# Patient Record
Sex: Male | Born: 1940 | Race: Black or African American | Hispanic: No | Marital: Married | Smoking: Former smoker
Health system: Southern US, Community
[De-identification: ages and names within clinical notes are randomized; demographics above are authoritative.]

## PROBLEM LIST (undated history)

## (undated) DIAGNOSIS — Z720 Tobacco use: Secondary | ICD-10-CM

## (undated) DIAGNOSIS — I519 Heart disease, unspecified: Secondary | ICD-10-CM

## (undated) DIAGNOSIS — C3490 Malignant neoplasm of unspecified part of unspecified bronchus or lung: Secondary | ICD-10-CM

## (undated) DIAGNOSIS — I10 Essential (primary) hypertension: Secondary | ICD-10-CM

## (undated) DIAGNOSIS — Z9221 Personal history of antineoplastic chemotherapy: Secondary | ICD-10-CM

## (undated) DIAGNOSIS — J40 Bronchitis, not specified as acute or chronic: Secondary | ICD-10-CM

## (undated) DIAGNOSIS — I2510 Atherosclerotic heart disease of native coronary artery without angina pectoris: Secondary | ICD-10-CM

## (undated) DIAGNOSIS — E785 Hyperlipidemia, unspecified: Secondary | ICD-10-CM

## (undated) DIAGNOSIS — H409 Unspecified glaucoma: Secondary | ICD-10-CM

## (undated) HISTORY — DX: Malignant neoplasm of unspecified part of unspecified bronchus or lung: C34.90

## (undated) HISTORY — DX: Bronchitis, not specified as acute or chronic: J40

## (undated) HISTORY — PX: OTHER SURGICAL HISTORY: SHX169

## (undated) HISTORY — DX: Hyperlipidemia, unspecified: E78.5

## (undated) HISTORY — PX: CARDIAC CATHETERIZATION: SHX172

## (undated) HISTORY — DX: Tobacco use: Z72.0

## (undated) HISTORY — DX: Essential (primary) hypertension: I10

## (undated) HISTORY — DX: Atherosclerotic heart disease of native coronary artery without angina pectoris: I25.10

## (undated) HISTORY — DX: Personal history of antineoplastic chemotherapy: Z92.21

## (undated) HISTORY — DX: Heart disease, unspecified: I51.9

---

## 1998-08-08 ENCOUNTER — Emergency Department (HOSPITAL_COMMUNITY): Admission: EM | Admit: 1998-08-08 | Discharge: 1998-08-08 | Payer: Self-pay | Admitting: Emergency Medicine

## 1998-09-16 ENCOUNTER — Emergency Department (HOSPITAL_COMMUNITY): Admission: EM | Admit: 1998-09-16 | Discharge: 1998-09-16 | Payer: Self-pay | Admitting: Emergency Medicine

## 1998-09-16 ENCOUNTER — Encounter: Payer: Self-pay | Admitting: Emergency Medicine

## 1998-09-17 ENCOUNTER — Emergency Department (HOSPITAL_COMMUNITY): Admission: EM | Admit: 1998-09-17 | Discharge: 1998-09-17 | Payer: Self-pay | Admitting: Emergency Medicine

## 1998-09-18 ENCOUNTER — Emergency Department (HOSPITAL_COMMUNITY): Admission: EM | Admit: 1998-09-18 | Discharge: 1998-09-18 | Payer: Self-pay | Admitting: Emergency Medicine

## 2000-09-19 ENCOUNTER — Encounter: Payer: Self-pay | Admitting: Emergency Medicine

## 2000-09-19 ENCOUNTER — Emergency Department (HOSPITAL_COMMUNITY): Admission: EM | Admit: 2000-09-19 | Discharge: 2000-09-19 | Payer: Self-pay | Admitting: Emergency Medicine

## 2001-04-04 ENCOUNTER — Encounter: Payer: Self-pay | Admitting: Emergency Medicine

## 2001-04-04 ENCOUNTER — Emergency Department (HOSPITAL_COMMUNITY): Admission: EM | Admit: 2001-04-04 | Discharge: 2001-04-04 | Payer: Self-pay | Admitting: Emergency Medicine

## 2006-11-01 ENCOUNTER — Emergency Department (HOSPITAL_COMMUNITY): Admission: EM | Admit: 2006-11-01 | Discharge: 2006-11-01 | Payer: Self-pay | Admitting: Emergency Medicine

## 2008-03-25 ENCOUNTER — Inpatient Hospital Stay (HOSPITAL_COMMUNITY): Admission: EM | Admit: 2008-03-25 | Discharge: 2008-03-26 | Payer: Self-pay | Admitting: Emergency Medicine

## 2008-05-14 ENCOUNTER — Encounter: Admission: RE | Admit: 2008-05-14 | Discharge: 2008-05-14 | Payer: Self-pay | Admitting: Neurosurgery

## 2008-06-04 ENCOUNTER — Emergency Department (HOSPITAL_COMMUNITY): Admission: EM | Admit: 2008-06-04 | Discharge: 2008-06-04 | Payer: Self-pay | Admitting: Emergency Medicine

## 2009-08-11 ENCOUNTER — Emergency Department (HOSPITAL_COMMUNITY): Admission: EM | Admit: 2009-08-11 | Discharge: 2009-08-11 | Payer: Self-pay | Admitting: Emergency Medicine

## 2010-01-02 ENCOUNTER — Emergency Department (HOSPITAL_COMMUNITY): Admission: EM | Admit: 2010-01-02 | Discharge: 2010-01-02 | Payer: Self-pay | Admitting: Emergency Medicine

## 2010-05-01 ENCOUNTER — Encounter: Payer: Self-pay | Admitting: Neurosurgery

## 2010-06-28 LAB — DIFFERENTIAL
Basophils Absolute: 0 10*3/uL (ref 0.0–0.1)
Basophils Relative: 1 % (ref 0–1)
Eosinophils Absolute: 0.1 10*3/uL (ref 0.0–0.7)
Monocytes Absolute: 0.6 10*3/uL (ref 0.1–1.0)
Monocytes Relative: 10 % (ref 3–12)
Neutro Abs: 2.7 10*3/uL (ref 1.7–7.7)
Neutrophils Relative %: 50 % (ref 43–77)

## 2010-06-28 LAB — BASIC METABOLIC PANEL
CO2: 30 mEq/L (ref 19–32)
Calcium: 9 mg/dL (ref 8.4–10.5)
Chloride: 107 mEq/L (ref 96–112)
Creatinine, Ser: 1.17 mg/dL (ref 0.4–1.5)
Glucose, Bld: 98 mg/dL (ref 70–99)

## 2010-06-28 LAB — CBC
MCHC: 34.4 g/dL (ref 30.0–36.0)
MCV: 86.2 fL (ref 78.0–100.0)
RDW: 14.9 % (ref 11.5–15.5)

## 2010-06-28 LAB — POCT CARDIAC MARKERS
CKMB, poc: 1.2 ng/mL (ref 1.0–8.0)
Myoglobin, poc: 78.3 ng/mL (ref 12–200)

## 2010-07-26 LAB — DIFFERENTIAL
Basophils Absolute: 0.1 10*3/uL (ref 0.0–0.1)
Basophils Relative: 1 % (ref 0–1)
Eosinophils Absolute: 0.1 10*3/uL (ref 0.0–0.7)
Monocytes Absolute: 0.7 10*3/uL (ref 0.1–1.0)
Monocytes Relative: 10 % (ref 3–12)
Neutro Abs: 3.6 10*3/uL (ref 1.7–7.7)

## 2010-07-26 LAB — POCT I-STAT, CHEM 8
Calcium, Ion: 1.15 mmol/L (ref 1.12–1.32)
Glucose, Bld: 109 mg/dL — ABNORMAL HIGH (ref 70–99)
HCT: 41 % (ref 39.0–52.0)
Hemoglobin: 13.9 g/dL (ref 13.0–17.0)
TCO2: 29 mmol/L (ref 0–100)

## 2010-07-26 LAB — CBC
Hemoglobin: 13.6 g/dL (ref 13.0–17.0)
MCHC: 33.9 g/dL (ref 30.0–36.0)
MCV: 85.5 fL (ref 78.0–100.0)
RBC: 4.7 MIL/uL (ref 4.22–5.81)
RDW: 15.1 % (ref 11.5–15.5)

## 2010-07-26 LAB — BRAIN NATRIURETIC PEPTIDE: Pro B Natriuretic peptide (BNP): <30 pg/mL (ref 0.0–100.0)

## 2010-07-26 LAB — POCT CARDIAC MARKERS: Myoglobin, poc: 129 ng/mL (ref 12–200)

## 2010-08-23 NOTE — Consult Note (Signed)
[name], [name]               ACCOUNT NO.:  [id number]   MEDICAL RECORD NO.:  [id number]          PATIENT TYPE:  EMS   LOCATION:  [facility]                         FACILITY:  [facility]   PHYSICIAN:  Cristi Loron, M.D.DATE OF BIRTH:  November 16, 1940   DATE OF CONSULTATION:  03/25/2008  DATE OF DISCHARGE:                                 CONSULTATION   CHIEF COMPLAINT:  Scalp soreness.   HISTORY OF PRESENT ILLNESS:  The patient is a 70 year old black male,  who was used to stay in good health until approximately 1830 this  evening.  He was approached by a friend of his dog, he was backing up,  he tripped and fell, striking the back of his head.  There was no loss  of consciousness.  The patient subsequently drove home.  He was  commenced by his daughter to come to the emergency department  approximately 2000.  He was evaluated by Dr. Oletta Lamas to include a cranial  CT scan which demonstrated the patient had a occipital skull fracture  and a traumatic subarachnoid hemorrhage.  A neurosurgical consultation  was requested.   Presently, the patient is accompanied by his family.  He is alert,  pleasant.  He has a minimal headache.  He has soreness of occipital  region.  He denies neck pain, back pain, numbness, tingling, weakness,  syncopal events presumably headaches prior to this fall, etc.   PAST MEDICAL HISTORY:  1. Hypertension.  2. Hypercholesteremia.  3. Coronary artery disease.   PAST SURGICAL HISTORY:  She had a heart catheterization by 15 years ago.   MEDICATIONS PRIOR TO ADMISSION:  1. Norvasc daily.  2. Toprol daily.  3. Cholesterol medicine, he does not know the dosage.   ALLERGIES:  He has no known drug allergies.   FAMILY MEDICAL HISTORY:  Noncontributory.   SOCIAL HISTORY:  The patient is married.  He has one daughter.  He is  employed delivering newspapers.  He quit smoking tobacco 6 weeks ago.  Prior to that, he has had been smoking for 40 years.  He occasionally  drinks.  Denies drug use.   REVIEW OF SYSTEMS:  Negative except as above.   PHYSICAL EXAMINATION:  GENERAL:  A pleasant 70 year old black male with  a small occipital skull laceration, in no apparent distress.  HEENT:  Normocephalic.  He does have a small approximately 1-cm  laceration on his occipital scalp.  His pupils are equal, round, and  reactive to light.  Extraocular muscle intact.  Oropharynx benign.  There is no CSF, otorrhea, or rhinorrhea, battle signs, raccoon's eyes.  NECK:  No mass or deformities, has a normal cervical range of motion.  Spurling's testing is negative.  Lhermitte sign is not present.  THORAX:  Symmetric.  ABDOMEN:  Soft.  EXTREMITIES:  Without deformities.  BACK:  Normal.  No point tenderness or deformities.  NEUROLOGIC:  The patient is alert and oriented x3.  Cranial nerves II  through XII examined bilaterally and grossly normal.  Vision and hearing  grossly normal bilaterally.  Motor strength is 5/5 in bilateral biceps,  triceps, hand  grip, quadriceps, gastrocnemius, extensor longus.  Sensory  function is intact to light and touch.  Sensation all tested dermatomes  bilaterally.  Cerebellar function is intact to rapid alternating  movements of the upper extremities bilaterally.  Deep tendon reflexes  are 1-2 biceps, triceps, quadriceps, gastrocnemius.  There is no ankle  clonus.   IMAGING STUDIES:  I have reviewed the patient's cranial CT scan  performed without contrast at [facility] on March 24, 2008,  demonstrates the patient has an occipital skull fracture in the midline.  He has a left-sided subarachnoid hemorrhage in the sylvian fissure a bit  over the convexity.   ASSESSMENT AND PLAN:  Closed-head injury, occipital skull fracture,  subtraumatic subarachnoid hemorrhage.  I have discussed situation with  the patient's family.  I recommended that he be admitted for  observation.  We will plan to repeat his CAT scan sometime tomorrow.   If  it is unchanged, discharge him tomorrow.  I have answered all their  questions.      Cristi Loron, M.D.  Electronically Signed     JDJ/MEDQ  D:  03/25/2008  T:  03/25/2008  Job:  [id number]

## 2011-01-12 LAB — DIFFERENTIAL
Basophils Absolute: 0 K/uL (ref 0.0–0.1)
Basophils Relative: 0 % (ref 0–1)
Eosinophils Absolute: 0.1 K/uL (ref 0.0–0.7)
Eosinophils Relative: 1 % (ref 0–5)
Lymphocytes Relative: 19 % (ref 12–46)
Lymphs Abs: 1.5 K/uL (ref 0.7–4.0)
Monocytes Absolute: 0.5 10*3/uL (ref 0.1–1.0)
Monocytes Relative: 7 % (ref 3–12)
Neutro Abs: 5.9 10*3/uL (ref 1.7–7.7)
Neutrophils Relative %: 73 % (ref 43–77)

## 2011-01-12 LAB — APTT: aPTT: 30 seconds (ref 24–37)

## 2011-01-12 LAB — SAMPLE TO BLOOD BANK

## 2011-01-12 LAB — PROTIME-INR
INR: 1 (ref 0.00–1.49)
Prothrombin Time: 12.8 s (ref 11.6–15.2)

## 2011-01-12 LAB — BASIC METABOLIC PANEL
CO2: 32 mEq/L (ref 19–32)
Calcium: 9.8 mg/dL (ref 8.4–10.5)
Chloride: 107 mEq/L (ref 96–112)
GFR calc Af Amer: 55 mL/min — ABNORMAL LOW (ref >60)
Glucose, Bld: 147 mg/dL — ABNORMAL HIGH (ref 70–99)
Potassium: 5 mEq/L (ref 3.5–5.1)
Sodium: 142 mEq/L (ref 135–145)

## 2011-01-12 LAB — CBC
HCT: 42.6 % (ref 39.0–52.0)
Hemoglobin: 14.1 g/dL (ref 13.0–17.0)
MCHC: 33.1 g/dL (ref 30.0–36.0)
MCV: 86.1 fL (ref 78.0–100.0)
Platelets: 223 K/uL (ref 150–400)
RBC: 4.95 MIL/uL (ref 4.22–5.81)
RDW: 14.7 % (ref 11.5–15.5)
WBC: 8 K/uL (ref 4.0–10.5)

## 2011-01-12 LAB — BASIC METABOLIC PANEL WITH GFR
BUN: 11 mg/dL (ref 6–23)
Creatinine, Ser: 1.55 mg/dL — ABNORMAL HIGH (ref 0.4–1.5)
GFR calc non Af Amer: 45 mL/min — ABNORMAL LOW (ref >60)

## 2011-05-30 ENCOUNTER — Ambulatory Visit (INDEPENDENT_AMBULATORY_CARE_PROVIDER_SITE_OTHER): Payer: PRIVATE HEALTH INSURANCE | Admitting: Family Medicine

## 2011-05-30 ENCOUNTER — Encounter: Payer: Self-pay | Admitting: Family Medicine

## 2011-05-30 VITALS — BP 135/85 | HR 59 | Temp 97.7°F | Ht 71.75 in | Wt 168.8 lb

## 2011-05-30 DIAGNOSIS — J209 Acute bronchitis, unspecified: Secondary | ICD-10-CM | POA: Insufficient documentation

## 2011-05-30 MED ORDER — ALBUTEROL SULFATE HFA 108 (90 BASE) MCG/ACT IN AERS: 2.0000 | INHALATION_SPRAY | RESPIRATORY_TRACT | Status: DC | PRN

## 2011-05-30 MED ORDER — AZITHROMYCIN 250 MG PO TABS: ORAL_TABLET | ORAL | Status: AC

## 2011-05-30 MED ORDER — BENZONATATE 200 MG PO CAPS: 200.0000 mg | ORAL_CAPSULE | Freq: Three times a day (TID) | ORAL | Status: AC | PRN

## 2011-05-30 MED ORDER — IPRATROPIUM BROMIDE 0.02 % IN SOLN
0.5000 mg | Freq: Once | RESPIRATORY_TRACT | Status: AC
  Administered 2011-05-30: 0.5 mg via RESPIRATORY_TRACT

## 2011-05-30 MED ORDER — ALBUTEROL SULFATE (5 MG/ML) 0.5% IN NEBU
2.5000 mg | INHALATION_SOLUTION | Freq: Once | RESPIRATORY_TRACT | Status: AC
  Administered 2011-05-30: 2.5 mg via RESPIRATORY_TRACT

## 2011-05-30 NOTE — Patient Instructions (Signed)
Schedule your complete physical at your convenience- don't eat before this This is bronchitis.  Start the Zpack to treat the infection Use the cough pills as needed Add Mucinex to thin your congestion Use the inhaler- 2 puffs every 4 hrs only as needed for coughing, shortness of breath, wheezing Call with any questions or concerns Welcome!  We're glad to have you!

## 2011-05-30 NOTE — Progress Notes (Signed)
  Subjective:    Patient ID: [name], male    DOB: 07/31/1940, 71 y.o.   MRN: [id number]  HPI New to establish.  Sees Dr Dava Najjar at [facility] in [place].  Not seen in last few years.  URI- pt reports 'real bad chest congestion' last week.  Has been taking OTC cold med w/ some relief.  No fevers.  + nasal congestion.  Denies facial pain/pressure.  + hoarseness.  Intermittent ear fullness.  + sick contacts.  No nausea, vomiting.  Cough- productive.   Review of Systems     Objective:   Physical Exam  Vitals reviewed. Constitutional: He appears well-developed and well-nourished. No distress.  HENT:  Head: Normocephalic and atraumatic.  Right Ear: Tympanic membrane normal.  Left Ear: Tympanic membrane normal.  Nose: No mucosal edema or rhinorrhea. Right sinus exhibits no maxillary sinus tenderness and no frontal sinus tenderness. Left sinus exhibits no maxillary sinus tenderness and no frontal sinus tenderness.  Mouth/Throat: Mucous membranes are normal. No oropharyngeal exudate, posterior oropharyngeal edema or posterior oropharyngeal erythema.  Eyes: Conjunctivae and EOM are normal. Pupils are equal, round, and reactive to light.  Neck: Normal range of motion. Neck supple.  Cardiovascular: Normal rate, regular rhythm and normal heart sounds.   Pulmonary/Chest: Effort normal. No respiratory distress. He has wheezes.       + hacking cough Coarse BS diffusely, much improved s/p neb  Lymphadenopathy:    He has no cervical adenopathy.  Skin: Skin is warm and dry.          Assessment & Plan:

## 2011-05-31 NOTE — Assessment & Plan Note (Signed)
New.  Start albuterol prn for SOB/wheezing.  Abx.  Tessalon prn for cough.  Reviewed supportive care and red flags that should prompt return.  Pt expressed understanding and is in agreement w/ plan.

## 2011-06-09 ENCOUNTER — Telehealth: Payer: Self-pay | Admitting: Cardiovascular Disease

## 2011-06-09 NOTE — Telephone Encounter (Signed)
Patient Records for new patient apnt.    [facility] 06[facility]12/11.  [facility] [phone] ext [phone] to obtain patient med records.    Patient can be reached at hm# [phone] for additional info  PCP Dr. Lonell Face, SSN last for #'s [phone]

## 2011-06-09 NOTE — Telephone Encounter (Signed)
The medical record for new patients are requested by you the scheduler. I verified it  with Leila.

## 2011-06-12 ENCOUNTER — Ambulatory Visit (INDEPENDENT_AMBULATORY_CARE_PROVIDER_SITE_OTHER): Payer: PRIVATE HEALTH INSURANCE | Admitting: Cardiovascular Disease

## 2011-06-12 ENCOUNTER — Telehealth: Payer: Self-pay | Admitting: Cardiovascular Disease

## 2011-06-12 ENCOUNTER — Encounter: Payer: Self-pay | Admitting: Cardiovascular Disease

## 2011-06-12 VITALS — BP 140/80 | HR 61 | Ht 70.0 in | Wt 170.0 lb

## 2011-06-12 DIAGNOSIS — I10 Essential (primary) hypertension: Secondary | ICD-10-CM

## 2011-06-12 DIAGNOSIS — I2510 Atherosclerotic heart disease of native coronary artery without angina pectoris: Secondary | ICD-10-CM

## 2011-06-12 DIAGNOSIS — E785 Hyperlipidemia, unspecified: Secondary | ICD-10-CM | POA: Insufficient documentation

## 2011-06-12 NOTE — Patient Instructions (Signed)
Your physician wants you to follow-up in:  12 months.  You will receive a reminder letter in the mail two months in advance. If you don't receive a letter, please call our office to schedule the follow-up appointment.   

## 2011-06-12 NOTE — Progress Notes (Signed)
   History of Present Illness: 71 yo male with history of HTN, HLD CAD with remote catheterization (?15 years ago), recent normal stress tests here today to establish cardiac care. He has been followed in the [place] in [facility] by Cardiology. He wishes to establish with [place] today. No recent chest pain, SOB, palpitations. 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 He has been taking all meds. BP has been well controlled at home.   Primary Care Physician: Neena Rhymes  Past Medical History  Diagnosis Date  . Bronchitis   . Glaucoma   . Heart disease   . Hyperlipidemia   . Hypertension   . CAD (coronary artery disease)     Remote cath ?1998.   . Tobacco abuse     Past Surgical History  Procedure Date  . None     Current Outpatient Prescriptions  Medication Sig Dispense Refill  . aspirin 81 MG tablet Take 81 mg by mouth daily.      . benzonatate (TESSALON) 200 MG capsule Pt had broncitis - But he still has tabs if he needs them for cough      . Calcium Carbonate-Vitamin D (CALCIUM + D PO) Take by mouth. Pt takes about 3 tabs daily      . lisinopril-hydrochlorothiazide (PRINZIDE,ZESTORETIC) 20-12.5 MG per tablet Take 1 tablet by mouth daily.      . simvastatin (ZOCOR) 80 MG tablet Take 80 mg by mouth at bedtime.        No Known Allergies  History   Social History  . Marital Status: Married    Spouse Name: N/A    Number of Children: 1  . Years of Education: N/A   Occupational History  . Retired-car salesman    Social History Main Topics  . Smoking status: Current Everyday Smoker -- 0.3 packs/day for 54 years  . Smokeless tobacco: Not on file   Comment: smokes a lot less than he used to  . Alcohol Use: No     1-2 beers a month  . Drug Use: No  . Sexually Active: Not on file   Other Topics Concern  . Not on file   Social History Narrative  . No narrative on file    Family History  Problem Relation Age of Onset  . Hyperlipidemia Mother   . Heart disease  Mother   . Stroke Mother   . Hypertension Mother   . Kidney disease Mother   . Diabetes Mother   . Diabetes Father   . Hypertension Maternal Grandfather   . Hyperlipidemia Paternal Grandmother   . Diabetes Paternal Grandmother   . Heart attack Maternal Grandfather     Review of Systems:  As stated in the HPI and otherwise negative.   BP 140/80  Pulse 61  Ht 5\' 10"  (1.778 m)  Wt 170 lb (77.111 kg)  BMI 24.39 kg/m2  Physical Examination: General: Well developed, well nourished, NAD HEENT: OP clear, mucus membranes moist SKIN: warm, dry. No rashes. Neuro: No focal deficits Musculoskeletal: Muscle strength 5/5 all ext Psychiatric: Mood and affect normal Neck: No JVD, no carotid bruits, no thyromegaly, no lymphadenopathy. Lungs:Clear bilaterally, no wheezes, rhonci, crackles Cardiovascular: Regular rate and rhythm. No murmurs, gallops or rubs. Abdomen:Soft. Bowel sounds present. Non-tender.  Extremities: No lower extremity edema. Pulses are 2 + in the bilateral DP/PT.  EKG: NSR, rate 61 bpm.

## 2011-06-12 NOTE — Assessment & Plan Note (Signed)
Will get records from [place]. Continue statin.

## 2011-06-12 NOTE — Telephone Encounter (Signed)
newmsg Pt was calling about records from [place]. Please call him back

## 2011-06-12 NOTE — Assessment & Plan Note (Signed)
BP within normal range. No changes.

## 2011-06-12 NOTE — Assessment & Plan Note (Signed)
He tells me that he has mild CAD by cath 1998. Await records from [place]. No changes. He is doing well.

## 2011-06-12 NOTE — Telephone Encounter (Signed)
I spoke with pt and told him we are faxing request to [facility] for paperwork to be sent for appt today

## 2011-07-11 ENCOUNTER — Ambulatory Visit (INDEPENDENT_AMBULATORY_CARE_PROVIDER_SITE_OTHER): Payer: PRIVATE HEALTH INSURANCE | Admitting: Family Medicine

## 2011-07-11 ENCOUNTER — Encounter: Payer: Self-pay | Admitting: Family Medicine

## 2011-07-11 DIAGNOSIS — I10 Essential (primary) hypertension: Secondary | ICD-10-CM

## 2011-07-11 DIAGNOSIS — F341 Dysthymic disorder: Secondary | ICD-10-CM

## 2011-07-11 DIAGNOSIS — E785 Hyperlipidemia, unspecified: Secondary | ICD-10-CM

## 2011-07-11 DIAGNOSIS — Z0000 Encounter for general adult medical examination without abnormal findings: Secondary | ICD-10-CM | POA: Insufficient documentation

## 2011-07-11 DIAGNOSIS — F418 Other specified anxiety disorders: Secondary | ICD-10-CM

## 2011-07-11 LAB — CBC WITH DIFFERENTIAL/PLATELET
Basophils Relative: 0.9 % (ref 0.0–3.0)
Eosinophils Relative: 2.6 % (ref 0.0–5.0)
Lymphocytes Relative: 35.6 % (ref 12.0–46.0)
MCV: 86.6 fl (ref 78.0–100.0)
Neutrophils Relative %: 49 % (ref 43.0–77.0)
RBC: 4.86 Mil/uL (ref 4.22–5.81)
WBC: 4.4 10*3/uL — ABNORMAL LOW (ref 4.5–10.5)

## 2011-07-11 LAB — BASIC METABOLIC PANEL
BUN: 18 mg/dL (ref 6–23)
Chloride: 105 mEq/L (ref 96–112)
Glucose, Bld: 104 mg/dL — ABNORMAL HIGH (ref 70–99)
Potassium: 4.1 mEq/L (ref 3.5–5.1)

## 2011-07-11 LAB — PSA: PSA: 1.2 ng/mL (ref 0.10–4.00)

## 2011-07-11 LAB — TSH: TSH: 1.82 u[IU]/mL (ref 0.35–5.50)

## 2011-07-11 LAB — LIPID PANEL: VLDL: 19.6 mg/dL (ref 0.0–40.0)

## 2011-07-11 LAB — HEPATIC FUNCTION PANEL
ALT: 13 U/L (ref 0–53)
AST: 21 U/L (ref 0–37)
Albumin: 4 g/dL (ref 3.5–5.2)

## 2011-07-11 NOTE — Assessment & Plan Note (Signed)
New.  Pt w/ + PH-Q2 and score of 10 on PHQ-9.  Pt in a difficult financial spot and wife is being hard on him.  Pt w/ hx of similar sxs.  Denies SI/HI- able to contract for safety.  Names and #s of psychiatrists provided.  Encouraged him to call.  Will follow closely.

## 2011-07-11 NOTE — Assessment & Plan Note (Signed)
Chronic problem.  Tolerating statin w/out difficulty.  Check labs.  Adjust meds prn  

## 2011-07-11 NOTE — Progress Notes (Signed)
  Subjective:    Patient ID: [name], male    DOB: [birth year], 71 y.o.   MRN: [id number]  HPI Here today for CPE.  Risk Factors: HTN- chronic problem for pt, well controlled today on Lisinopril HCT.  No CP, SOB, HAs, visual changes, edema.  Hyperlipidemia- chronic problem.  On Zocor 80mg .  Denies abd pain, N/V, myalgias.  Physical Activity: not exercising but plans to start Fall Risk: low risk Depression: + PHQ-2, admits to stress about finances.  Hx of similar.  Denies SI/HI.  Has seen psych at [facility] who did not feel pt needed to stay, recommended outpt tx.  Apparently wife will frequently tell him he's 'crazy'. Hearing: normal to conversational tones, whispered voice ADL's: independent. Cognitive: normal linear thought process, memory and attention intact Home Safety: safe at home but does not get along w/ wife Height, Weight, BMI, Visual Acuity: see vitals, vision corrected to 20/20 w/ laser surgery Counseling: UTD on colonoscopy Labs Ordered: See A&P Care Plan: See A&P    Review of Systems Patient reports no vision/hearing changes, anorexia, fever ,adenopathy, persistant/recurrent hoarseness, swallowing issues, chest pain, palpitations, edema, persistant/recurrent cough, hemoptysis, dyspnea (rest,exertional, paroxysmal nocturnal), gastrointestinal  bleeding (melena, rectal bleeding), abdominal pain, excessive heart burn, GU symptoms (dysuria, hematuria, voiding/incontinence issues) syncope, focal weakness, memory loss, numbness & tingling, skin/hair/nail changes, abnormal bruising/bleeding, musculoskeletal symptoms/signs.     Objective:   Physical Exam BP 128/82  Pulse 53  Temp(Src) 97.5 F (36.4 C) (Oral)  Ht 5\' 11"  (1.803 m)  Wt 172 lb 6.4 oz (78.2 kg)  BMI 24.04 kg/m2  SpO2 99%  General Appearance:    Alert, cooperative, no distress, appears stated age  Head:    Normocephalic, without obvious abnormality, atraumatic  Eyes:    PERRL, conjunctiva/corneas clear, EOM's  intact, fundi    benign, both eyes       Ears:    Normal TM's and external ear canals, both ears  Nose:   Nares normal, septum midline, mucosa normal, no drainage   or sinus tenderness  Throat:   Lips, mucosa, and tongue normal; teeth and gums normal  Neck:   Supple, symmetrical, trachea midline, no adenopathy;       thyroid:  No enlargement/tenderness/nodules  Back:     Symmetric, no curvature, ROM normal, no CVA tenderness  Lungs:     Clear to auscultation bilaterally, respirations unlabored  Chest wall:    No tenderness or deformity  Heart:    Regular rate and rhythm, S1 and S2 normal, no murmur, rub   or gallop  Abdomen:     Soft, non-tender, bowel sounds active all four quadrants,    no masses, no organomegaly  Genitalia:    Normal male without lesion, discharge or tenderness  Rectal:    Normal tone, normal prostate, no masses or tenderness  Extremities:   Extremities normal, atraumatic, no cyanosis or edema  Pulses:   2+ and symmetric all extremities  Skin:   Skin color, texture, turgor normal, no rashes or lesions  Lymph nodes:   Cervical, supraclavicular, and axillary nodes normal  Neurologic:   CNII-XII intact. Normal strength, sensation and reflexes      throughout          Assessment & Plan:

## 2011-07-11 NOTE — Assessment & Plan Note (Signed)
Chronic problem.  Well controlled.  Asymptomatic.  No changes. 

## 2011-07-11 NOTE — Assessment & Plan Note (Signed)
Pt's PE WNL w/ exception of depression.  Check labs.  UTD on colonoscopy.  Anticipatory guidance provided.

## 2011-07-11 NOTE — Patient Instructions (Signed)
Follow up in 3 months to recheck mood Please call a psychiatrist to get an appt- this is very important We'll notify you of your lab results Call with any questions or concerns Hang in there!!!

## 2011-07-13 ENCOUNTER — Encounter: Payer: Self-pay | Admitting: *Deleted

## 2011-10-10 ENCOUNTER — Ambulatory Visit (INDEPENDENT_AMBULATORY_CARE_PROVIDER_SITE_OTHER): Payer: PRIVATE HEALTH INSURANCE | Admitting: Family Medicine

## 2011-10-10 ENCOUNTER — Encounter: Payer: Self-pay | Admitting: Family Medicine

## 2011-10-10 VITALS — BP 140/90 | HR 58 | Temp 97.6°F | Ht 69.0 in | Wt 172.2 lb

## 2011-10-10 DIAGNOSIS — F341 Dysthymic disorder: Secondary | ICD-10-CM

## 2011-10-10 DIAGNOSIS — I10 Essential (primary) hypertension: Secondary | ICD-10-CM

## 2011-10-10 DIAGNOSIS — F418 Other specified anxiety disorders: Secondary | ICD-10-CM

## 2011-10-10 MED ORDER — CITALOPRAM HYDROBROMIDE 20 MG PO TABS: 20.0000 mg | ORAL_TABLET | Freq: Every day | ORAL | Status: DC

## 2011-10-10 NOTE — Progress Notes (Signed)
  Subjective:    Patient ID: [name], male    DOB: 1940/12/17, 71 y.o.   MRN: [id number]  HPI Depression- pt reports slight improvement in mood but 'not much'.  Pt reports he called psych but never heard back.  Then lost list.  Pt continues to stress about money, wife is very stressful.  Would be able to get financial assistance if getting <$1200/month and he is getting $1240.  HTN- chronic problem, slightly elevated today but pt feels this is normal for him.  No CP, SOB, HAs, visual changes.  On Lisinopril HCT.  [facility]   Review of Systems For ROS see HPI     Objective:   Physical Exam  Vitals reviewed. Constitutional: He is oriented to person, place, and time. He appears well-developed and well-nourished. No distress.  HENT:  Head: Normocephalic and atraumatic.  Eyes: Conjunctivae and EOM are normal. Pupils are equal, round, and reactive to light.  Neck: Normal range of motion. Neck supple. No thyromegaly present.  Cardiovascular: Normal rate, regular rhythm, normal heart sounds and intact distal pulses.   No murmur heard. Pulmonary/Chest: Effort normal and breath sounds normal. No respiratory distress.  Abdominal: Soft. Bowel sounds are normal. He exhibits no distension.  Musculoskeletal: He exhibits no edema.  Lymphadenopathy:    He has no cervical adenopathy.  Neurological: He is alert and oriented to person, place, and time. No cranial nerve deficit.  Skin: Skin is warm and dry.  Psychiatric: He has a normal mood and affect. His behavior is normal.          Assessment & Plan:

## 2011-10-10 NOTE — Assessment & Plan Note (Signed)
Unchanged.  Pt never established w/ psych as recommended at last visit.  Feels mood may have minimally improved.  Still having money and marital troubles.  Has flat affect.  Has thought about leaving wife but 'that wouldn't go well'.  Will start low dose SSRI and follow closely- over phone if need be as pt has trouble w/ co-pay.  Reviewed supportive care and red flags that should prompt return.  Pt expressed understanding and is in agreement w/ plan.

## 2011-10-10 NOTE — Patient Instructions (Addendum)
Follow up in 4-6 weeks (you can do this by phone) Start the Celexa daily for your mood/depression- this is $4 at [facility] and you can call the [place] and see if you can get it from them Call with any questions or concerns Hang in there!!!

## 2011-10-10 NOTE — Assessment & Plan Note (Signed)
Unchanged.  Adequate but not ideal control- [facility].

## 2012-04-04 ENCOUNTER — Emergency Department (HOSPITAL_COMMUNITY): Payer: No Typology Code available for payment source

## 2012-04-04 ENCOUNTER — Encounter (HOSPITAL_COMMUNITY): Payer: Self-pay | Admitting: *Deleted

## 2012-04-04 ENCOUNTER — Emergency Department (HOSPITAL_COMMUNITY)
Admission: EM | Admit: 2012-04-04 | Discharge: 2012-04-05 | Disposition: A | Discharge status: Home / Self Care | Payer: No Typology Code available for payment source | Attending: Emergency Medicine | Admitting: Emergency Medicine

## 2012-04-04 DIAGNOSIS — I10 Essential (primary) hypertension: Secondary | ICD-10-CM | POA: Insufficient documentation

## 2012-04-04 DIAGNOSIS — F17200 Nicotine dependence, unspecified, uncomplicated: Secondary | ICD-10-CM | POA: Insufficient documentation

## 2012-04-04 DIAGNOSIS — Z7982 Long term (current) use of aspirin: Secondary | ICD-10-CM | POA: Insufficient documentation

## 2012-04-04 DIAGNOSIS — Z8679 Personal history of other diseases of the circulatory system: Secondary | ICD-10-CM | POA: Insufficient documentation

## 2012-04-04 DIAGNOSIS — E785 Hyperlipidemia, unspecified: Secondary | ICD-10-CM | POA: Insufficient documentation

## 2012-04-04 DIAGNOSIS — Z79899 Other long term (current) drug therapy: Secondary | ICD-10-CM | POA: Insufficient documentation

## 2012-04-04 DIAGNOSIS — Z8669 Personal history of other diseases of the nervous system and sense organs: Secondary | ICD-10-CM | POA: Insufficient documentation

## 2012-04-04 DIAGNOSIS — R059 Cough, unspecified: Secondary | ICD-10-CM | POA: Insufficient documentation

## 2012-04-04 DIAGNOSIS — Z8709 Personal history of other diseases of the respiratory system: Secondary | ICD-10-CM | POA: Insufficient documentation

## 2012-04-04 DIAGNOSIS — R05 Cough: Secondary | ICD-10-CM

## 2012-04-04 DIAGNOSIS — I2510 Atherosclerotic heart disease of native coronary artery without angina pectoris: Secondary | ICD-10-CM | POA: Insufficient documentation

## 2012-04-04 LAB — CBC
HCT: 36.7 % — ABNORMAL LOW (ref 39.0–52.0)
MCH: 27.8 pg (ref 26.0–34.0)
MCHC: 34.3 g/dL (ref 30.0–36.0)
RDW: 14.8 % (ref 11.5–15.5)

## 2012-04-04 MED ORDER — AZITHROMYCIN 250 MG PO TABS: 250.0000 mg | ORAL_TABLET | Freq: Every day | ORAL | Status: DC

## 2012-04-04 MED ORDER — ALBUTEROL SULFATE (5 MG/ML) 0.5% IN NEBU
2.5000 mg | INHALATION_SOLUTION | Freq: Once | RESPIRATORY_TRACT | Status: AC
  Administered 2012-04-04: 2.5 mg via RESPIRATORY_TRACT
  Filled 2012-04-04: qty 20

## 2012-04-04 NOTE — ED Provider Notes (Addendum)
History     CSN: [id number]  Arrival date & time 04/04/12  4098   First MD Initiated Contact with Patient 04/04/12 2256      Chief Complaint  Patient presents with  . Cough    (Consider location/radiation/quality/duration/timing/severity/associated sxs/prior treatment) Patient is a 71 y.o. male presenting with cough. The history is provided by the patient.  Cough This is a chronic problem. The current episode started more than 1 week ago. The problem occurs constantly. The problem has been gradually worsening. Cough characteristics: one bloody streak today. There has been no fever. Pertinent negatives include no chills, no sweats and no weight loss. He has tried nothing for the symptoms. His past medical history is significant for bronchitis.    Past Medical History  Diagnosis Date  . Bronchitis   . Glaucoma   . Heart disease   . Hyperlipidemia   . Hypertension   . CAD (coronary artery disease)     Remote cath ?1998.   . Tobacco abuse     Past Surgical History  Procedure Date  . None     Family History  Problem Relation Age of Onset  . Hyperlipidemia Mother   . Heart disease Mother   . Stroke Mother   . Hypertension Mother   . Kidney disease Mother   . Diabetes Mother   . Diabetes Father   . Hypertension Maternal Grandfather   . Hyperlipidemia Paternal Grandmother   . Diabetes Paternal Grandmother   . Heart attack Maternal Grandfather     History  Substance Use Topics  . Smoking status: Current Every Day Smoker -- 0.3 packs/day for 54 years  . Smokeless tobacco: Not on file     Comment: smokes a lot less than he used to  . Alcohol Use: No     Comment: 1-2 beers a month      Review of Systems  Constitutional: Negative for chills and weight loss.  Respiratory: Positive for cough.   All other systems reviewed and are negative.    Allergies  Review of patient's allergies indicates no known allergies.  Home Medications   Current Outpatient Rx    Name  Route  Sig  Dispense  Refill  . ASPIRIN 81 MG PO TABS   Oral   Take 81 mg by mouth daily.         . ATORVASTATIN CALCIUM 80 MG PO TABS   Oral   Take 40 mg by mouth daily.         Marland Kitchen CITALOPRAM HYDROBROMIDE 20 MG PO TABS   Oral   Take 1 tablet (20 mg total) by mouth daily.   30 tablet   3   . LISINOPRIL-HYDROCHLOROTHIAZIDE 20-12.5 MG PO TABS   Oral   Take 1 tablet by mouth daily.           BP 152/67  Pulse 55  Temp 98.2 F (36.8 C) (Oral)  Resp 20  SpO2 96%  Physical Exam  Constitutional: He is oriented to person, place, and time. He appears well-developed and well-nourished.  HENT:  Head: Normocephalic and atraumatic.  Eyes: Conjunctivae normal are normal. Pupils are equal, round, and reactive to light.  Neck: Normal range of motion. Neck supple.  Cardiovascular: Normal rate, regular rhythm, normal heart sounds and intact distal pulses.   Pulmonary/Chest: Effort normal and breath sounds normal.  Abdominal: Soft. Bowel sounds are normal.  Neurological: He is alert and oriented to person, place, and time.  Skin: Skin is warm and  dry.  Psychiatric: He has a normal mood and affect. His behavior is normal. Judgment and thought content normal.    ED Course  Procedures (including critical care time)  Labs Reviewed - No data to display Dg Chest 2 View  04/04/2012  *RADIOLOGY REPORT*  Clinical Data: 71 year old male with cough and wheezing.  CHEST - 2 VIEW  Comparison: 01/02/2010  Findings: The cardiomediastinal silhouette is unremarkable. Mild COPD/emphysema is noted. There is no evidence of focal airspace disease, pulmonary edema, suspicious pulmonary nodule/mass, pleural effusion, or pneumothorax. No acute bony abnormalities are identified.  IMPRESSION: COPD/emphysema without evidence of acute cardiopulmonary disease.   Original Report Authenticated By: Harmon Pier, M.D.      No diagnosis found.    MDM  Will check cbc,  reassess Minimal hemoptysis at  home.  None in ed.  cxr without mass.  No recent travel no weight loss.  Will abx.  Advised pt that smoking and age puts him at greater risk for malignancy,  And advised to the importance of close further outpt fu.  Will dc.  Also advised smoking cessation. Pt states understands and agrees with discharge instructions.       Saif Peter Lytle Michaels, MD 04/04/12 1610  Rosanne Ashing, MD 04/04/12 9604

## 2012-04-04 NOTE — ED Notes (Signed)
Cold cough with chest congestion for one week.  He cough up blood earlier today

## 2012-04-09 ENCOUNTER — Telehealth: Payer: Self-pay | Admitting: *Deleted

## 2012-04-09 NOTE — Telephone Encounter (Signed)
Agree w ER eval

## 2012-04-09 NOTE — Telephone Encounter (Signed)
[facility] Triage Call Report Triage Record Num: [id number] Operator: Kelle Darting Patient Name: [name] Call Date & Time: 04/04/2012 5:28:11PM Patient Phone: [phone] PCP: Lezlie Octave Patient Gender: Male PCP Fax : [phone] Patient DOB: 11-Feb-1941 Practice Name: [facility] Reason for Call: Caller: [name]/Patient; PCP: Sheliah Hatch.; CB#: [phone]; Call regarding Cough/Congestion; Afebrile; Onset: 03/26/12; Sx notes: Started with a sore throat and has progressed to a cough and chest congestion in his chest, also a stuffy nose, has tried OTC (Tylenol with cold, Mucinex) meds with no help, has had blood in his sputum that has went from bright red to a darker red/brown, not every time; also had an abscess come up on the left side, on top gum line, was swollen more yesterday, down today but is still sore to the touch, has been using a hot cloth compress; Guideline used: Cough; Disposition: See provider within 4 hours due to blood streaked sputum; Appt. made: No; Patient is going to go to [name] for evaluation. Protocol(s) Used: Cough - Adult Recommended Outcome per Protocol: See Provider within 4 hours Reason for Outcome: Blood streaked sputum Care Advice: ~ Another adult should drive. 12/

## 2012-05-10 ENCOUNTER — Ambulatory Visit (INDEPENDENT_AMBULATORY_CARE_PROVIDER_SITE_OTHER): Payer: No Typology Code available for payment source | Admitting: Family Medicine

## 2012-05-10 ENCOUNTER — Encounter: Payer: Self-pay | Admitting: Family Medicine

## 2012-05-10 VITALS — BP 136/80 | HR 53 | Temp 98.2°F | Ht 71.75 in | Wt 170.8 lb

## 2012-05-10 DIAGNOSIS — J449 Chronic obstructive pulmonary disease, unspecified: Secondary | ICD-10-CM

## 2012-05-10 DIAGNOSIS — M545 Low back pain, unspecified: Secondary | ICD-10-CM

## 2012-05-10 DIAGNOSIS — IMO0001 Reserved for inherently not codable concepts without codable children: Secondary | ICD-10-CM

## 2012-05-10 DIAGNOSIS — J4489 Other specified chronic obstructive pulmonary disease: Secondary | ICD-10-CM

## 2012-05-10 MED ORDER — NAPROXEN 500 MG PO TABS: 500.0000 mg | ORAL_TABLET | Freq: Two times a day (BID) | ORAL | Status: DC

## 2012-05-10 MED ORDER — BECLOMETHASONE DIPROPIONATE 80 MCG/ACT IN AERS: 1.0000 | INHALATION_SPRAY | Freq: Two times a day (BID) | RESPIRATORY_TRACT | Status: DC

## 2012-05-10 MED ORDER — TIZANIDINE HCL 2 MG PO TABS: 2.0000 mg | ORAL_TABLET | Freq: Four times a day (QID) | ORAL | Status: DC | PRN

## 2012-05-10 NOTE — Patient Instructions (Addendum)
Start the Qvar-1 puff twice daily Start the Naproxen twice daily (take w/ food) for 7-10 days and then as needed Use the Zanaflex nightly for muscle spasm- will cause drowsiness Heat your back for pain relief Call with any questions or concerns Hang in there!

## 2012-05-10 NOTE — Progress Notes (Signed)
  Subjective:    Patient ID: [name], male    DOB: [birth year], 72 y.o.   MRN: [id number]  HPI Bronchitis- went to ER 12/26 and dx'd.  tx'd w/ Zpack.  Cough has improved.  No SOB unless exercising.  Cough is no longer productive.  Xray showed component of COPD- pt is a smoker  Back pain- pt was doing yard work on Monday and 'my back snapped'.  Pain is center of low back.  No radiation into butt or thighs.  No numbness or weakness.  No bowel or bladder incontinence.  No fevers.  Took BC powders w/ some relief.   Review of Systems For ROS see HPI     Objective:   Physical Exam  Vitals reviewed. Constitutional: He is oriented to person, place, and time. He appears well-developed and well-nourished. No distress.  HENT:  Head: Normocephalic and atraumatic.  Right Ear: Tympanic membrane normal.  Left Ear: Tympanic membrane normal.  Nose: No mucosal edema or rhinorrhea. Right sinus exhibits no maxillary sinus tenderness and no frontal sinus tenderness. Left sinus exhibits no maxillary sinus tenderness and no frontal sinus tenderness.  Mouth/Throat: Mucous membranes are normal. No oropharyngeal exudate, posterior oropharyngeal edema or posterior oropharyngeal erythema.  Eyes: Conjunctivae normal and EOM are normal. Pupils are equal, round, and reactive to light.  Neck: Normal range of motion. Neck supple.  Cardiovascular: Normal rate, regular rhythm and normal heart sounds.   Pulmonary/Chest: Effort normal. No respiratory distress. He has wheezes (faint scattered wheezes). He has no rales.       No cough heard  Musculoskeletal: He exhibits no edema.       (-) SLR bilaterally Good back flexion, + pain w/ extension + TTP over lumbar paraspinals  Lymphadenopathy:    He has no cervical adenopathy.  Neurological: He is alert and oriented to person, place, and time. He has normal reflexes.  Skin: Skin is warm and dry.          Assessment & Plan:

## 2012-05-12 DIAGNOSIS — IMO0001 Reserved for inherently not codable concepts without codable children: Secondary | ICD-10-CM | POA: Insufficient documentation

## 2012-05-12 DIAGNOSIS — M545 Low back pain, unspecified: Secondary | ICD-10-CM | POA: Insufficient documentation

## 2012-05-12 NOTE — Assessment & Plan Note (Signed)
New.  Pt is recovering well from his recent acute on chronic bronchitis.  Still having some scattered wheezes.  Will start daily Qvar along w/ albuterol prn and monitor for improvement.  Pt expressed understanding and is in agreement w/ plan.

## 2012-05-12 NOTE — Assessment & Plan Note (Signed)
New.  Muscular.  Start scheduled short term NSAIDs and muscle relaxer prn.  Reviewed supportive care and red flags that should prompt return.  Pt expressed understanding and is in agreement w/ plan.

## 2012-12-19 ENCOUNTER — Encounter (HOSPITAL_COMMUNITY): Payer: Self-pay | Admitting: *Deleted

## 2012-12-19 ENCOUNTER — Emergency Department (HOSPITAL_COMMUNITY)
Admission: EM | Admit: 2012-12-19 | Discharge: 2012-12-20 | Disposition: A | Discharge status: Home / Self Care | Payer: No Typology Code available for payment source | Attending: Emergency Medicine | Admitting: Emergency Medicine

## 2012-12-19 DIAGNOSIS — I2510 Atherosclerotic heart disease of native coronary artery without angina pectoris: Secondary | ICD-10-CM | POA: Insufficient documentation

## 2012-12-19 DIAGNOSIS — F17200 Nicotine dependence, unspecified, uncomplicated: Secondary | ICD-10-CM | POA: Insufficient documentation

## 2012-12-19 DIAGNOSIS — C3490 Malignant neoplasm of unspecified part of unspecified bronchus or lung: Secondary | ICD-10-CM | POA: Insufficient documentation

## 2012-12-19 DIAGNOSIS — Z79899 Other long term (current) drug therapy: Secondary | ICD-10-CM | POA: Insufficient documentation

## 2012-12-19 DIAGNOSIS — Z8709 Personal history of other diseases of the respiratory system: Secondary | ICD-10-CM | POA: Insufficient documentation

## 2012-12-19 DIAGNOSIS — H409 Unspecified glaucoma: Secondary | ICD-10-CM | POA: Insufficient documentation

## 2012-12-19 DIAGNOSIS — E785 Hyperlipidemia, unspecified: Secondary | ICD-10-CM | POA: Insufficient documentation

## 2012-12-19 DIAGNOSIS — I10 Essential (primary) hypertension: Secondary | ICD-10-CM | POA: Insufficient documentation

## 2012-12-19 DIAGNOSIS — IMO0002 Reserved for concepts with insufficient information to code with codable children: Secondary | ICD-10-CM | POA: Insufficient documentation

## 2012-12-19 DIAGNOSIS — L03113 Cellulitis of right upper limb: Secondary | ICD-10-CM

## 2012-12-19 NOTE — ED Notes (Signed)
Pt states he noticed a bug bite on his R lower arm, states it's swollen and sore to touch.

## 2012-12-19 NOTE — ED Provider Notes (Signed)
This chart was scribed for Cornelia Copa, a non-physician practitioner working with Toy Baker, MD by Lewanda Rife, ED Scribe. This patient was seen in room WTR7/WTR7 and the patient's care was started at 2324.    CSN: [id number]     Arrival date & time 12/19/12  2139 History   None    Chief Complaint  Patient presents with  . Insect Bite   The history is provided by the patient.   HPI Comments: [name] is a 72 y.o. male who presents to the Emergency Department complaining of possible bug bite of right forearm onset 6 pm today. Describes pain as constant mild soreness. Reports pain is exacerbated by touch and alleviated my nothing.  There is associated swelling and lump to the area. Denies associated itching, numbness, fever, chills, or other areas of rash, and shortness of breath. Reports trying topical OTC cream with no relief of symptoms. Reports pertinent PMHx of stage IV lung cancer with first treatment of chemotherapy one week ago, hyperlipidemia, and CAD.  12/25/12 appointment with oncologist at [facility] hospital    Past Medical History  Diagnosis Date  . Bronchitis   . Glaucoma   . Heart disease   . Hyperlipidemia   . Hypertension   . CAD (coronary artery disease)     Remote cath ?1998.   . Tobacco abuse    Past Surgical History  Procedure Laterality Date  . None     Family History  Problem Relation Age of Onset  . Hyperlipidemia Mother   . Heart disease Mother   . Stroke Mother   . Hypertension Mother   . Kidney disease Mother   . Diabetes Mother   . Diabetes Father   . Hypertension Maternal Grandfather   . Hyperlipidemia Paternal Grandmother   . Diabetes Paternal Grandmother   . Heart attack Maternal Grandfather    History  Substance Use Topics  . Smoking status: Current Every Day Smoker -- 0.30 packs/day for 54 years  . Smokeless tobacco: Not on file     Comment: smokes a lot less than he used to  . Alcohol Use: No     Comment: 1-2 beers  a month    Review of Systems  Constitutional: Negative for fever, chills, diaphoresis and fatigue.  Neurological: Negative for weakness and numbness.  All other systems reviewed and are negative.   A complete 10 system review of systems was obtained and all systems are negative except as noted in the HPI and PMH.    Allergies  Review of patient's allergies indicates no known allergies.  Home Medications   Current Outpatient Rx  Name  Route  Sig  Dispense  Refill  . atorvastatin (LIPITOR) 80 MG tablet   Oral   Take 40 mg by mouth at bedtime.          . beclomethasone (QVAR) 80 MCG/ACT inhaler   Inhalation   Inhale 1 puff into the lungs 2 (two) times daily.   1 Inhaler   12   . carvedilol (COREG) 6.25 MG tablet   Oral   Take 6.25 mg by mouth 2 (two) times daily with a meal.         . HYDROcodone-acetaminophen (NORCO/VICODIN) 5-325 MG per tablet   Oral   Take 1 tablet by mouth every 6 (six) hours as needed for pain.         Marland Kitchen lisinopril-hydrochlorothiazide (PRINZIDE,ZESTORETIC) 20-12.5 MG per tablet   Oral   Take 1 tablet  by mouth daily.         . prochlorperazine (COMPAZINE) 5 MG tablet   Oral   Take 5 mg by mouth every 6 (six) hours as needed for nausea.          BP 170/78  Pulse 66  Temp(Src) 98.2 F (36.8 C) (Oral)  Resp 16  Ht 5' 10.5" (1.791 m)  Wt 170 lb (77.111 kg)  BMI 24.04 kg/m2  SpO2 99% Physical Exam  Nursing note and vitals reviewed. Constitutional: He is oriented to person, place, and time. He appears well-developed and well-nourished. No distress.  HENT:  Head: Normocephalic and atraumatic.  Eyes: Conjunctivae and EOM are normal.  Neck: Neck supple. No tracheal deviation present.  Cardiovascular: Normal rate and regular rhythm.   Pulmonary/Chest: Effort normal and breath sounds normal. No respiratory distress. He has no wheezes.  Musculoskeletal: Normal range of motion.  Neurological: He is alert and oriented to person, place, and  time.  Skin: Skin is warm and dry.  Small area of induration, mild warmth, minimal surrounding erythema over right lateral forearm. No signs of trauma to the skin. No fluctuance or signs of abscess.  Psychiatric: He has a normal mood and affect. His behavior is normal.    ED Course  Procedures  Medications - No data to display  Results for orders placed during the hospital encounter of 12/19/12  CBC WITH DIFFERENTIAL      Result Value Range   WBC 3.0 (*) 4.0 - 10.5 K/uL   RBC 4.06 (*) 4.22 - 5.81 MIL/uL   Hemoglobin 10.2 (*) 13.0 - 17.0 g/dL   HCT 78.2 (*) 95.6 - 21.3 %   MCV 77.8 (*) 78.0 - 100.0 fL   MCH 25.1 (*) 26.0 - 34.0 pg   MCHC 32.3  30.0 - 36.0 g/dL   RDW 08.6 (*) 57.8 - 46.9 %   Platelets 227  150 - 400 K/uL   Neutrophils Relative % 11 (*) 43 - 77 %   Lymphocytes Relative 62 (*) 12 - 46 %   Monocytes Relative 21 (*) 3 - 12 %   Eosinophils Relative 5  0 - 5 %   Basophils Relative 1  0 - 1 %   Neutro Abs 0.3 (*) 1.7 - 7.7 K/uL   Lymphs Abs 1.9  0.7 - 4.0 K/uL   Monocytes Absolute 0.6  0.1 - 1.0 K/uL   Eosinophils Absolute 0.2  0.0 - 0.7 K/uL   Basophils Absolute 0.0  0.0 - 0.1 K/uL   Smear Review MORPHOLOGY UNREMARKABLE    POCT I-STAT, CHEM 8      Result Value Range   Sodium 144  135 - 145 mEq/L   Potassium 3.9  3.5 - 5.1 mEq/L   Chloride 103  96 - 112 mEq/L   BUN 17  6 - 23 mg/dL   Creatinine, Ser 6.29 (*) 0.50 - 1.35 mg/dL   Glucose, Bld 528 (*) 70 - 99 mg/dL   Calcium, Ion 4.13  2.44 - 1.30 mmol/L   TCO2 27  0 - 100 mmol/L   Hemoglobin 11.2 (*) 13.0 - 17.0 g/dL   HCT 01.0 (*) 27.2 - 53.6 %        MDM   1. Cellulitis of forearm, right      Patient seen and evaluated. He appears well no acute distress. Afebrile. No complaints of fevers.    I personally performed the services described in this documentation, which was scribed in my presence. The  recorded information has been reviewed and is accurate.   Angus Seller, PA-C 12/20/12 [phone]

## 2012-12-20 LAB — CBC WITH DIFFERENTIAL/PLATELET
Basophils Absolute: 0 10*3/uL (ref 0.0–0.1)
Eosinophils Relative: 5 % (ref 0–5)
MCH: 25.1 pg — ABNORMAL LOW (ref 26.0–34.0)
Monocytes Absolute: 0.6 10*3/uL (ref 0.1–1.0)
Neutrophils Relative %: 11 % — ABNORMAL LOW (ref 43–77)
Platelets: 227 10*3/uL (ref 150–400)
RBC: 4.06 MIL/uL — ABNORMAL LOW (ref 4.22–5.81)
RDW: 15.8 % — ABNORMAL HIGH (ref 11.5–15.5)
WBC: 3 10*3/uL — ABNORMAL LOW (ref 4.0–10.5)

## 2012-12-20 LAB — POCT I-STAT, CHEM 8
BUN: 17 mg/dL (ref 6–23)
Calcium, Ion: 1.22 mmol/L (ref 1.13–1.30)
HCT: 33 % — ABNORMAL LOW (ref 39.0–52.0)
Hemoglobin: 11.2 g/dL — ABNORMAL LOW (ref 13.0–17.0)
Sodium: 144 mEq/L (ref 135–145)
TCO2: 27 mmol/L (ref 0–100)

## 2012-12-20 LAB — PATHOLOGIST SMEAR REVIEW

## 2012-12-20 MED ORDER — CEPHALEXIN 500 MG PO CAPS: 500.0000 mg | ORAL_CAPSULE | Freq: Three times a day (TID) | ORAL | Status: DC

## 2012-12-26 NOTE — ED Provider Notes (Signed)
Medical screening examination/treatment/procedure(s) were performed by non-physician practitioner and as supervising physician I was immediately available for consultation/collaboration.   Ben Habermann T Safari Cinque, MD 12/26/12 0902 

## 2013-05-16 ENCOUNTER — Encounter (HOSPITAL_COMMUNITY): Payer: Self-pay | Admitting: Emergency Medicine

## 2013-05-16 ENCOUNTER — Emergency Department (HOSPITAL_COMMUNITY): Payer: Medicare Other

## 2013-05-16 ENCOUNTER — Emergency Department (HOSPITAL_COMMUNITY)
Admission: EM | Admit: 2013-05-16 | Discharge: 2013-05-17 | Disposition: A | Discharge status: Home / Self Care | Payer: Medicare Other | Attending: Emergency Medicine | Admitting: Emergency Medicine

## 2013-05-16 DIAGNOSIS — R42 Dizziness and giddiness: Secondary | ICD-10-CM | POA: Insufficient documentation

## 2013-05-16 DIAGNOSIS — Z79899 Other long term (current) drug therapy: Secondary | ICD-10-CM | POA: Insufficient documentation

## 2013-05-16 DIAGNOSIS — H409 Unspecified glaucoma: Secondary | ICD-10-CM | POA: Insufficient documentation

## 2013-05-16 DIAGNOSIS — Z7982 Long term (current) use of aspirin: Secondary | ICD-10-CM | POA: Insufficient documentation

## 2013-05-16 DIAGNOSIS — Z862 Personal history of diseases of the blood and blood-forming organs and certain disorders involving the immune mechanism: Secondary | ICD-10-CM | POA: Insufficient documentation

## 2013-05-16 DIAGNOSIS — R079 Chest pain, unspecified: Secondary | ICD-10-CM

## 2013-05-16 DIAGNOSIS — IMO0002 Reserved for concepts with insufficient information to code with codable children: Secondary | ICD-10-CM | POA: Insufficient documentation

## 2013-05-16 DIAGNOSIS — I10 Essential (primary) hypertension: Secondary | ICD-10-CM | POA: Insufficient documentation

## 2013-05-16 DIAGNOSIS — R059 Cough, unspecified: Secondary | ICD-10-CM

## 2013-05-16 DIAGNOSIS — Z85118 Personal history of other malignant neoplasm of bronchus and lung: Secondary | ICD-10-CM | POA: Insufficient documentation

## 2013-05-16 DIAGNOSIS — Z87891 Personal history of nicotine dependence: Secondary | ICD-10-CM | POA: Insufficient documentation

## 2013-05-16 DIAGNOSIS — M7989 Other specified soft tissue disorders: Secondary | ICD-10-CM | POA: Insufficient documentation

## 2013-05-16 DIAGNOSIS — K5900 Constipation, unspecified: Secondary | ICD-10-CM | POA: Insufficient documentation

## 2013-05-16 DIAGNOSIS — Z8639 Personal history of other endocrine, nutritional and metabolic disease: Secondary | ICD-10-CM | POA: Insufficient documentation

## 2013-05-16 DIAGNOSIS — R05 Cough: Secondary | ICD-10-CM

## 2013-05-16 DIAGNOSIS — I2510 Atherosclerotic heart disease of native coronary artery without angina pectoris: Secondary | ICD-10-CM | POA: Insufficient documentation

## 2013-05-16 DIAGNOSIS — R0789 Other chest pain: Secondary | ICD-10-CM | POA: Insufficient documentation

## 2013-05-16 DIAGNOSIS — Z8709 Personal history of other diseases of the respiratory system: Secondary | ICD-10-CM | POA: Insufficient documentation

## 2013-05-16 LAB — COMPREHENSIVE METABOLIC PANEL
ALT: 12 U/L (ref 0–53)
AST: 20 U/L (ref 0–37)
Albumin: 3.5 g/dL (ref 3.5–5.2)
Alkaline Phosphatase: 100 U/L (ref 39–117)
BUN: 11 mg/dL (ref 6–23)
CALCIUM: 9.2 mg/dL (ref 8.4–10.5)
CO2: 25 meq/L (ref 19–32)
CREATININE: 1.05 mg/dL (ref 0.50–1.35)
Chloride: 104 mEq/L (ref 96–112)
GFR, EST AFRICAN AMERICAN: 80 mL/min — AB (ref >90)
GFR, EST NON AFRICAN AMERICAN: 69 mL/min — AB (ref >90)
GLUCOSE: 114 mg/dL — AB (ref 70–99)
Potassium: 3.8 mEq/L (ref 3.7–5.3)
Sodium: 141 mEq/L (ref 137–147)
Total Bilirubin: 0.3 mg/dL (ref 0.3–1.2)
Total Protein: 8.1 g/dL (ref 6.0–8.3)

## 2013-05-16 LAB — CBC
HEMATOCRIT: 37.8 % — AB (ref 39.0–52.0)
HEMOGLOBIN: 12.4 g/dL — AB (ref 13.0–17.0)
MCH: 25.6 pg — AB (ref 26.0–34.0)
MCHC: 32.8 g/dL (ref 30.0–36.0)
MCV: 77.9 fL — AB (ref 78.0–100.0)
Platelets: 168 10*3/uL (ref 150–400)
RBC: 4.85 MIL/uL (ref 4.22–5.81)
RDW: 16.8 % — ABNORMAL HIGH (ref 11.5–15.5)
WBC: 6.4 10*3/uL (ref 4.0–10.5)

## 2013-05-16 LAB — PRO B NATRIURETIC PEPTIDE: Pro B Natriuretic peptide (BNP): 88.2 pg/mL (ref 0–125)

## 2013-05-16 LAB — POCT I-STAT TROPONIN I: Troponin i, poc: 0 ng/mL (ref 0.00–0.08)

## 2013-05-16 NOTE — ED Notes (Signed)
PA at bedside.

## 2013-05-16 NOTE — ED Provider Notes (Signed)
CSN: [id number]     Arrival date & time 05/16/13  2205 History   First MD Initiated Contact with Patient 05/16/13 2237     Chief Complaint  Patient presents with  . Chest Pain   (Consider location/radiation/quality/duration/timing/severity/associated sxs/prior Treatment) HPI 73 yo AA male with hx of RIGHT sided lung cancer presents with RIGHT sided 7/10 Chest pain that he experiences when he coughs. Cough and Chest pain started today between 6-7pm. Chest pain described as sharp without radiation. Admits to productive cough. Patient reports hemoptysis that occurred earlier today x 1 episode. Chest pain not worse with activity.  Admits to DOE since he was diagnosed with Lung cancer in last July.  Patient denies chest pain or shortness of breath at rest. Patient is a patient at [place].    Cardiac Catheterization approximately "20 years ago" Advanced age > 31 yo: Yes HTN: Yes Hyperlipidemia: Yes Cigarette smoking: No Diabetes Mellitus: No Family hx of CAD or MI < 33 yo : No Male or Post menopausal: Yes Cocaine use: No Prior MI: No CABG: No Stress test: Yes "couple years ago"     Past Medical History  Diagnosis Date  . Bronchitis   . Glaucoma   . Heart disease   . Hyperlipidemia   . Hypertension   . CAD (coronary artery disease)     Remote cath ?1998.   . Tobacco abuse   . Cancer     lung   Past Surgical History  Procedure Laterality Date  . None    . Cardiac catheterization     Family History  Problem Relation Age of Onset  . Hyperlipidemia Mother   . Heart disease Mother   . Stroke Mother   . Hypertension Mother   . Kidney disease Mother   . Diabetes Mother   . Diabetes Father   . Hypertension Maternal Grandfather   . Hyperlipidemia Paternal Grandmother   . Diabetes Paternal Grandmother   . Heart attack Maternal Grandfather    History  Substance Use Topics  . Smoking status: Former Smoker -- 0.30 packs/day for 54 years  . Smokeless tobacco: Not on file      Comment: smokes a lot less than he used to  . Alcohol Use: No    Review of Systems  Constitutional: Negative for fever and chills.  HENT: Negative for congestion, rhinorrhea and sore throat.   Cardiovascular: Positive for leg swelling. Negative for palpitations.  Gastrointestinal: Positive for constipation. Negative for nausea, vomiting, abdominal pain and diarrhea.  Neurological: Positive for light-headedness.  All other systems reviewed and are negative.    Allergies  Review of patient's allergies indicates no known allergies.  Home Medications   Current Outpatient Rx  Name  Route  Sig  Dispense  Refill  . amoxicillin (AMOXIL) 500 MG capsule   Oral   Take 500 mg by mouth every 6 (six) hours.         Marland Kitchen aspirin EC 81 MG tablet   Oral   Take 81 mg by mouth daily.         Marland Kitchen atorvastatin (LIPITOR) 80 MG tablet   Oral   Take 40 mg by mouth at bedtime.          . beclomethasone (QVAR) 80 MCG/ACT inhaler   Inhalation   Inhale 1 puff into the lungs 2 (two) times daily.   1 Inhaler   12   . carvedilol (COREG) 6.25 MG tablet   Oral   Take 6.25 mg by  mouth 2 (two) times daily with a meal.         . dextromethorphan-guaiFENesin (MUCINEX DM) 30-600 MG per 12 hr tablet   Oral   Take 1 tablet by mouth 2 (two) times daily as needed for cough.         Marland Kitchen LORazepam (ATIVAN) 0.5 MG tablet   Oral   Take 0.5 mg by mouth every 6 (six) hours as needed for anxiety.         . ondansetron (ZOFRAN) 8 MG tablet   Oral   Take 8 mg by mouth every 8 (eight) hours as needed for nausea or vomiting.         Marland Kitchen oxyCODONE (OXY IR/ROXICODONE) 5 MG immediate release tablet   Oral   Take 5-10 mg by mouth every 4 (four) hours as needed for severe pain.         Marland Kitchen Phenylephrine-DM-GG-APAP (TYLENOL COLD MULTI-SYMPTOM DAY) 5-10-200-325 MG/15ML LIQD   Oral   Take 15 mLs by mouth every 8 (eight) hours as needed (cold).         . senna (SENOKOT) 8.6 MG TABS tablet   Oral   Take 1  tablet by mouth daily.         . vitamin C (ASCORBIC ACID) 500 MG tablet   Oral   Take 500 mg by mouth daily.         Marland Kitchen amLODipine (NORVASC) 5 MG tablet   Oral   Take 1 tablet (5 mg total) by mouth daily.   15 tablet   0   . furosemide (LASIX) 20 MG tablet   Oral   Take 1 tablet (20 mg total) by mouth daily as needed for fluid.   15 tablet   0   . guaiFENesin-codeine 100-10 MG/5ML syrup   Oral   Take 5 mLs by mouth 3 (three) times daily as needed for cough.   120 mL   0    BP 167/70  Pulse 68  Resp 19  Ht 5\' 10"  (1.778 m)  Wt 130 lb (58.968 kg)  BMI 18.65 kg/m2  SpO2 97% Physical Exam  Nursing note and vitals reviewed. Constitutional: He is oriented to person, place, and time. He appears well-developed and well-nourished. No distress.  HENT:  Head: Normocephalic and atraumatic.  Nose: Nose normal.  Mouth/Throat: Uvula is midline, oropharynx is clear and moist and mucous membranes are normal.  Eyes: Conjunctivae and EOM are normal.  Neck: Normal range of motion. Neck supple. No JVD present.  Cardiovascular: Normal rate and regular rhythm.  Exam reveals no gallop and no friction rub.   No murmur heard. Pulmonary/Chest: Effort normal and breath sounds normal. Not tachypneic. No respiratory distress. He has no wheezes. He has no rales. He exhibits no tenderness.  Abdominal: Soft. Bowel sounds are normal. There is no tenderness.  Musculoskeletal: Normal range of motion. He exhibits edema (1+ pitting edema (does not extend above ankle)).  Neurological: He is alert and oriented to person, place, and time.  Skin: Skin is warm and dry. He is not diaphoretic.  Psychiatric: He has a normal mood and affect. His behavior is normal.    ED Course  Procedures (including critical care time) Labs Review Labs Reviewed  CBC - Abnormal; Notable for the following:    Hemoglobin 12.4 (*)    HCT 37.8 (*)    MCV 77.9 (*)    MCH 25.6 (*)    RDW 16.8 (*)    All other components  within normal limits  COMPREHENSIVE METABOLIC PANEL - Abnormal; Notable for the following:    Glucose, Bld 114 (*)    GFR calc non Af Amer 69 (*)    GFR calc Af Amer 80 (*)    All other components within normal limits  PROTIME-INR  PRO B NATRIURETIC PEPTIDE  POCT I-STAT TROPONIN I   Imaging Review Ct Angio Chest Pe W/cm &/or Wo Cm  05/17/2013   CLINICAL DATA:  73 year old male with chest pain. History of lung cancer.  EXAM: CT ANGIOGRAPHY CHEST WITH CONTRAST  TECHNIQUE: Multidetector CT imaging of the chest was performed using the standard protocol during bolus administration of intravenous contrast. Multiplanar CT image reconstructions including MIPs were obtained to evaluate the vascular anatomy.  CONTRAST:  153mL OMNIPAQUE IOHEXOL 350 MG/ML SOLN  COMPARISON:  05/16/2013 and prior chest radiographs.  FINDINGS: This is a technically satisfactory study.  No pulmonary emboli are identified. There is no evidence of abdominal aortic aneurysm.  Findings compatible with left ventricular hypertrophy noted.  Right hilar lymphadenopathy is identified within an index node measuring 2.5 cm (image 58). An 11 mm AP window lymph node is identified (image 48).  A 3 x 3.5 cm posterior right perihilar mass/adenopathy is present.  There are multiple pulmonary nodules within the right upper, right middle and right lower lobes, primarily along the pleural surfaces compatible with metastatic disease.  Mild centrilobular emphysema is noted.  There is no evidence of pleural or pericardial effusion.  The visualized upper abdomen is unremarkable.  No acute or suspicious bony abnormalities are identified.  Review of the MIP images confirms the above findings.  IMPRESSION: 3 x 3.5 cm posterior right perihilar mass/adenopathy, right pulmonary nodules and right hilar adenopathy compatible with neoplasm and metastatic spread. Equivocal AP window lymph node involvement.  No evidence of pulmonary emboli.  Emphysema.   Electronically  Signed   By: Hassan Rowan M.D.   On: 05/17/2013 02:32   Dg Chest Portable 1 View  05/16/2013   CLINICAL DATA:  73 year old male with right chest pain.  EXAM: PORTABLE CHEST - 1 VIEW  COMPARISON:  04/04/2012 and prior chest radiographs dating back to 06/04/2008  FINDINGS: The cardiomediastinal silhouette is unremarkable.  Mild pulmonary vascular congestion noted.  Nodular opacities overlying the mid and lower right lung are identified - pulmonary nodules are not excluded.  There is no evidence of focal airspace disease, pulmonary edema, pleural effusion, or pneumothorax. No acute bony abnormalities are identified.  IMPRESSION: Mild pulmonary vascular congestion.  Nodular opacities overlying the mid and lower right lung- chest CT with contrast is recommended for further evaluation.   Electronically Signed   By: Hassan Rowan M.D.   On: 05/16/2013 22:53    EKG Interpretation    Date/Time:  Friday May 16 2013 22:22:45 EST Ventricular Rate:  68 PR Interval:  195 QRS Duration: 114 QT Interval:  457 QTC Calculation: 486 R Axis:   -23 Text Interpretation:  Sinus rhythm Consider anterior infarct No old tracing to compare Confirmed by OTTER  MD, OLGA ([id number]) on 05/17/2013 12:29:16 AM            MDM   1. Chest pain   2. Cough     Patient afebrile with elevated BP. Pulse ox 98% on RA Troponin Negative.  CXR shows Mild pulmonary vascular congestion. Nodular opacities overlying mid and lower right lung. Consistent with patient hx of Lung Cancer BNP WNL  Microcytic anemia improved since previous.  PT/INR - WNL  CT Chest angio shows no evidence of pulmonary emboli. Shows emphysema and metastatic lung cancer. [facility], no prior CT records available for comparison.    Plan to have patient follow up with the [facility] oncologist for further management of Lung cancer. Will treat patient's sxs. Patient agrees with plan. Discharged in good condition.   Refilled patient's BP medication because he  is out. BP elevated at today's visit. Advised patient to see his PCP next week.   Meds given in ED:  Medications  iohexol (OMNIPAQUE) 350 MG/ML injection 100 mL (100 mLs Intravenous Contrast Given 05/17/13 0145)    New Prescriptions   GUAIFENESIN-CODEINE 100-10 MG/5ML SYRUP    Take 5 mLs by mouth 3 (three) times daily as needed for cough.     amLODipine (NORVASC) 5 MG tablet   Oral   Take 1 tablet (5 mg total) by mouth daily.   15 tablet   0   furosemide (LASIX) 20 MG tablet   Oral   Take 1 tablet (20 mg total) by mouth daily as needed for fluid.   15 tablet   0             Dossie Arbour [place], [place] 05/17/13 [place], PA-C 05/17/13 [phone]

## 2013-05-16 NOTE — ED Notes (Signed)
Pt c/o R sided chest pain radiating to R axillary onset 1800 today. Pt states pain worse with cough. Pt is a lung cancer patient, pt stopped chemo d/t abscess in mouth. [facility] is [facility]

## 2013-05-17 ENCOUNTER — Emergency Department (HOSPITAL_COMMUNITY): Payer: Medicare Other

## 2013-05-17 LAB — PROTIME-INR
INR: 1.02 (ref 0.00–1.49)
PROTHROMBIN TIME: 13.2 s (ref 11.6–15.2)

## 2013-05-17 MED ORDER — FUROSEMIDE 20 MG PO TABS: 20.0000 mg | ORAL_TABLET | Freq: Every day | ORAL | Status: DC | PRN

## 2013-05-17 MED ORDER — IOHEXOL 350 MG/ML SOLN
100.0000 mL | Freq: Once | INTRAVENOUS | Status: AC | PRN
  Administered 2013-05-17: 100 mL via INTRAVENOUS

## 2013-05-17 MED ORDER — AMLODIPINE BESYLATE 5 MG PO TABS: 5.0000 mg | ORAL_TABLET | Freq: Every day | ORAL | Status: DC

## 2013-05-17 MED ORDER — GUAIFENESIN-CODEINE 100-10 MG/5ML PO SOLN: 5.0000 mL | Freq: Three times a day (TID) | ORAL | Status: DC | PRN

## 2013-05-17 NOTE — Discharge Instructions (Signed)
Follow up with your care providers at the [facility] hospital in 2 days. Take medications for cough as prescribed. Return to ED if you develop progressive Chest pain or shortness of breath.    Chest Pain (Nonspecific) It is often hard to give a specific diagnosis for the cause of chest pain. There is always a chance that your pain could be related to something serious, such as a heart attack or a blood clot in the lungs. You need to follow up with your caregiver for further evaluation. CAUSES   Heartburn.  Pneumonia or bronchitis.  Anxiety or stress.  Inflammation around your heart (pericarditis) or lung (pleuritis or pleurisy).  A blood clot in the lung.  A collapsed lung (pneumothorax). It can develop suddenly on its own (spontaneous pneumothorax) or from injury (trauma) to the chest.  Shingles infection (herpes zoster virus). The chest wall is composed of bones, muscles, and cartilage. Any of these can be the source of the pain.  The bones can be bruised by injury.  The muscles or cartilage can be strained by coughing or overwork.  The cartilage can be affected by inflammation and become sore (costochondritis). DIAGNOSIS  Lab tests or other studies, such as X-rays, electrocardiography, stress testing, or cardiac imaging, may be needed to find the cause of your pain.  TREATMENT   Treatment depends on what may be causing your chest pain. Treatment may include:  Acid blockers for heartburn.  Anti-inflammatory medicine.  Pain medicine for inflammatory conditions.  Antibiotics if an infection is present.  You may be advised to change lifestyle habits. This includes stopping smoking and avoiding alcohol, caffeine, and chocolate.  You may be advised to keep your head raised (elevated) when sleeping. This reduces the chance of acid going backward from your stomach into your esophagus.  Most of the time, nonspecific chest pain will improve within 2 to 3 days with rest and mild pain  medicine. HOME CARE INSTRUCTIONS   If antibiotics were prescribed, take your antibiotics as directed. Finish them even if you start to feel better.  For the next few days, avoid physical activities that bring on chest pain. Continue physical activities as directed.  Do not smoke.  Avoid drinking alcohol.  Only take over-the-counter or prescription medicine for pain, discomfort, or fever as directed by your caregiver.  Follow your caregiver's suggestions for further testing if your chest pain does not go away.  Keep any follow-up appointments you made. If you do not go to an appointment, you could develop lasting (chronic) problems with pain. If there is any problem keeping an appointment, you must call to reschedule. SEEK MEDICAL CARE IF:   You think you are having problems from the medicine you are taking. Read your medicine instructions carefully.  Your chest pain does not go away, even after treatment.  You develop a rash with blisters on your chest. SEEK IMMEDIATE MEDICAL CARE IF:   You have increased chest pain or pain that spreads to your arm, neck, jaw, back, or abdomen.  You develop shortness of breath, an increasing cough, or you are coughing up blood.  You have severe back or abdominal pain, feel nauseous, or vomit.  You develop severe weakness, fainting, or chills.  You have a fever. THIS IS AN EMERGENCY. Do not wait to see if the pain will go away. Get medical help at once. Call your local emergency services (911 in [place] Do not drive yourself to the hospital. MAKE SURE YOU:   Understand these  instructions.  Will watch your condition.  Will get help right away if you are not doing well or get worse. Document Released: 01/04/2005 Document Revised: 06/19/2011 Document Reviewed: 10/31/2007 [facility] Patient Information 2014 [place].

## 2013-05-17 NOTE — ED Provider Notes (Signed)
Medical screening examination/treatment/procedure(s) were conducted as a shared visit with non-physician practitioner(s) and myself.  I personally evaluated the patient during the encounter.  EKG Interpretation    Date/Time:  Friday May 16 2013 22:22:45 EST Ventricular Rate:  68 PR Interval:  195 QRS Duration: 114 QT Interval:  457 QTC Calculation: 486 R Axis:   -23 Text Interpretation:  Sinus rhythm Consider anterior infarct No old tracing to compare Confirmed by Nathanael Krist  MD, Hedaya [place]endresse ([id number]) on 05/17/2013 12:29:16 AM             Kalman Drape, MD 05/17/13 [phone]

## 2013-08-17 ENCOUNTER — Inpatient Hospital Stay (HOSPITAL_COMMUNITY)
Admission: EM | Admit: 2013-08-17 | Discharge: 2013-08-19 | DRG: 180 | Disposition: A | Discharge status: Home / Self Care | Payer: Non-veteran care | Attending: Internal Medicine | Admitting: Internal Medicine

## 2013-08-17 ENCOUNTER — Encounter (HOSPITAL_COMMUNITY): Payer: Self-pay | Admitting: Emergency Medicine

## 2013-08-17 ENCOUNTER — Emergency Department (HOSPITAL_COMMUNITY): Payer: Non-veteran care

## 2013-08-17 DIAGNOSIS — I2510 Atherosclerotic heart disease of native coronary artery without angina pectoris: Secondary | ICD-10-CM | POA: Diagnosis present

## 2013-08-17 DIAGNOSIS — E43 Unspecified severe protein-calorie malnutrition: Secondary | ICD-10-CM | POA: Diagnosis present

## 2013-08-17 DIAGNOSIS — D638 Anemia in other chronic diseases classified elsewhere: Secondary | ICD-10-CM | POA: Diagnosis present

## 2013-08-17 DIAGNOSIS — E41 Nutritional marasmus: Secondary | ICD-10-CM | POA: Diagnosis present

## 2013-08-17 DIAGNOSIS — C3490 Malignant neoplasm of unspecified part of unspecified bronchus or lung: Secondary | ICD-10-CM | POA: Diagnosis present

## 2013-08-17 DIAGNOSIS — Z79899 Other long term (current) drug therapy: Secondary | ICD-10-CM | POA: Diagnosis not present

## 2013-08-17 DIAGNOSIS — N179 Acute kidney failure, unspecified: Secondary | ICD-10-CM | POA: Diagnosis present

## 2013-08-17 DIAGNOSIS — J449 Chronic obstructive pulmonary disease, unspecified: Secondary | ICD-10-CM | POA: Diagnosis present

## 2013-08-17 DIAGNOSIS — H409 Unspecified glaucoma: Secondary | ICD-10-CM | POA: Diagnosis present

## 2013-08-17 DIAGNOSIS — J4489 Other specified chronic obstructive pulmonary disease: Secondary | ICD-10-CM | POA: Diagnosis present

## 2013-08-17 DIAGNOSIS — IMO0001 Reserved for inherently not codable concepts without codable children: Secondary | ICD-10-CM

## 2013-08-17 DIAGNOSIS — Z833 Family history of diabetes mellitus: Secondary | ICD-10-CM | POA: Diagnosis not present

## 2013-08-17 DIAGNOSIS — J9600 Acute respiratory failure, unspecified whether with hypoxia or hypercapnia: Secondary | ICD-10-CM | POA: Diagnosis present

## 2013-08-17 DIAGNOSIS — E785 Hyperlipidemia, unspecified: Secondary | ICD-10-CM | POA: Diagnosis present

## 2013-08-17 DIAGNOSIS — M545 Low back pain, unspecified: Secondary | ICD-10-CM

## 2013-08-17 DIAGNOSIS — Z823 Family history of stroke: Secondary | ICD-10-CM

## 2013-08-17 DIAGNOSIS — Z8249 Family history of ischemic heart disease and other diseases of the circulatory system: Secondary | ICD-10-CM

## 2013-08-17 DIAGNOSIS — I10 Essential (primary) hypertension: Secondary | ICD-10-CM | POA: Diagnosis present

## 2013-08-17 DIAGNOSIS — Z87891 Personal history of nicotine dependence: Secondary | ICD-10-CM | POA: Diagnosis not present

## 2013-08-17 DIAGNOSIS — F418 Other specified anxiety disorders: Secondary | ICD-10-CM

## 2013-08-17 DIAGNOSIS — J209 Acute bronchitis, unspecified: Secondary | ICD-10-CM

## 2013-08-17 LAB — CBC WITH DIFFERENTIAL/PLATELET
BASOS PCT: 1 % (ref 0–1)
Basophils Absolute: 0 10*3/uL (ref 0.0–0.1)
EOS ABS: 0.1 10*3/uL (ref 0.0–0.7)
EOS PCT: 1 % (ref 0–5)
HEMATOCRIT: 30.9 % — AB (ref 39.0–52.0)
HEMOGLOBIN: 10.1 g/dL — AB (ref 13.0–17.0)
LYMPHS ABS: 2.2 10*3/uL (ref 0.7–4.0)
Lymphocytes Relative: 31 % (ref 12–46)
MCH: 25.1 pg — AB (ref 26.0–34.0)
MCHC: 32.7 g/dL (ref 30.0–36.0)
MCV: 76.9 fL — AB (ref 78.0–100.0)
MONO ABS: 0.9 10*3/uL (ref 0.1–1.0)
MONOS PCT: 12 % (ref 3–12)
Neutro Abs: 3.9 10*3/uL (ref 1.7–7.7)
Neutrophils Relative %: 55 % (ref 43–77)
Platelets: 542 10*3/uL — ABNORMAL HIGH (ref 150–400)
RBC: 4.02 MIL/uL — AB (ref 4.22–5.81)
RDW: 17.5 % — ABNORMAL HIGH (ref 11.5–15.5)
WBC: 7.1 10*3/uL (ref 4.0–10.5)

## 2013-08-17 LAB — BASIC METABOLIC PANEL
BUN: 11 mg/dL (ref 6–23)
CO2: 32 mEq/L (ref 19–32)
CREATININE: 1.68 mg/dL — AB (ref 0.50–1.35)
Calcium: 9.2 mg/dL (ref 8.4–10.5)
Chloride: 104 mEq/L (ref 96–112)
GFR, EST AFRICAN AMERICAN: 45 mL/min — AB (ref >90)
GFR, EST NON AFRICAN AMERICAN: 39 mL/min — AB (ref >90)
GLUCOSE: 118 mg/dL — AB (ref 70–99)
Potassium: 4.3 mEq/L (ref 3.7–5.3)
Sodium: 145 mEq/L (ref 137–147)

## 2013-08-17 MED ORDER — DM-GUAIFENESIN ER 30-600 MG PO TB12
1.0000 | ORAL_TABLET | Freq: Two times a day (BID) | ORAL | Status: DC | PRN
  Filled 2013-08-17: qty 1

## 2013-08-17 MED ORDER — MIRTAZAPINE 15 MG PO TABS
15.0000 mg | ORAL_TABLET | Freq: Every day | ORAL | Status: DC
  Administered 2013-08-17 – 2013-08-18 (×2): 15 mg via ORAL
  Filled 2013-08-17 (×3): qty 1

## 2013-08-17 MED ORDER — PREDNISONE 10 MG PO TABS: 20.0000 mg | ORAL_TABLET | Freq: Every day | ORAL | Status: DC

## 2013-08-17 MED ORDER — DOCUSATE SODIUM 100 MG PO CAPS
200.0000 mg | ORAL_CAPSULE | Freq: Two times a day (BID) | ORAL | Status: DC
  Administered 2013-08-17 – 2013-08-19 (×4): 200 mg via ORAL
  Filled 2013-08-17 (×5): qty 2

## 2013-08-17 MED ORDER — SODIUM CHLORIDE 0.9 % IJ SOLN
3.0000 mL | INTRAMUSCULAR | Status: DC | PRN
Start: 2013-08-17 — End: 2013-08-19

## 2013-08-17 MED ORDER — SODIUM CHLORIDE 0.9 % IV SOLN: 250.0000 mL | INTRAVENOUS | Status: DC | PRN

## 2013-08-17 MED ORDER — ALBUTEROL SULFATE (2.5 MG/3ML) 0.083% IN NEBU
2.5000 mg | INHALATION_SOLUTION | RESPIRATORY_TRACT | Status: DC | PRN
Start: 2013-08-17 — End: 2013-08-19

## 2013-08-17 MED ORDER — ALBUTEROL SULFATE (2.5 MG/3ML) 0.083% IN NEBU: 2.5000 mg | INHALATION_SOLUTION | Freq: Four times a day (QID) | RESPIRATORY_TRACT | Status: DC | PRN

## 2013-08-17 MED ORDER — ONDANSETRON HCL 4 MG PO TABS: 8.0000 mg | ORAL_TABLET | Freq: Three times a day (TID) | ORAL | Status: DC | PRN

## 2013-08-17 MED ORDER — SODIUM CHLORIDE 0.9 % IV SOLN
INTRAVENOUS | Status: DC
  Administered 2013-08-17 – 2013-08-18 (×2): via INTRAVENOUS

## 2013-08-17 MED ORDER — METHYLPREDNISOLONE SODIUM SUCC 40 MG IJ SOLR
40.0000 mg | Freq: Four times a day (QID) | INTRAMUSCULAR | Status: DC
  Administered 2013-08-17 – 2013-08-18 (×3): 40 mg via INTRAVENOUS
  Filled 2013-08-17 (×5): qty 1

## 2013-08-17 MED ORDER — LEVOFLOXACIN 500 MG PO TABS: 500.0000 mg | ORAL_TABLET | Freq: Every day | ORAL | Status: DC

## 2013-08-17 MED ORDER — CARVEDILOL 6.25 MG PO TABS
6.2500 mg | ORAL_TABLET | Freq: Two times a day (BID) | ORAL | Status: DC
  Administered 2013-08-17 – 2013-08-19 (×4): 6.25 mg via ORAL
  Filled 2013-08-17 (×6): qty 1

## 2013-08-17 MED ORDER — FOLIC ACID 1 MG PO TABS
1.0000 mg | ORAL_TABLET | Freq: Every day | ORAL | Status: DC
  Administered 2013-08-17 – 2013-08-19 (×3): 1 mg via ORAL
  Filled 2013-08-17 (×3): qty 1

## 2013-08-17 MED ORDER — OXYCODONE HCL 5 MG PO TABS: 5.0000 mg | ORAL_TABLET | ORAL | Status: DC | PRN

## 2013-08-17 MED ORDER — SODIUM CHLORIDE 0.9 % IJ SOLN
3.0000 mL | Freq: Two times a day (BID) | INTRAMUSCULAR | Status: DC
  Administered 2013-08-17: 3 mL via INTRAVENOUS

## 2013-08-17 MED ORDER — BECLOMETHASONE DIPROPIONATE 80 MCG/ACT IN AERS: 1.0000 | INHALATION_SPRAY | Freq: Two times a day (BID) | RESPIRATORY_TRACT | Status: DC

## 2013-08-17 MED ORDER — ENOXAPARIN SODIUM 40 MG/0.4ML ~~LOC~~ SOLN
40.0000 mg | SUBCUTANEOUS | Status: DC
  Administered 2013-08-17 – 2013-08-18 (×2): 40 mg via SUBCUTANEOUS
  Filled 2013-08-17 (×3): qty 0.4

## 2013-08-17 MED ORDER — ASPIRIN EC 81 MG PO TBEC
81.0000 mg | DELAYED_RELEASE_TABLET | Freq: Every day | ORAL | Status: DC
  Administered 2013-08-18 – 2013-08-19 (×2): 81 mg via ORAL
  Filled 2013-08-17 (×2): qty 1

## 2013-08-17 MED ORDER — LEVOFLOXACIN 500 MG PO TABS
500.0000 mg | ORAL_TABLET | Freq: Once | ORAL | Status: AC
  Administered 2013-08-17: 500 mg via ORAL
  Filled 2013-08-17: qty 1

## 2013-08-17 MED ORDER — ONDANSETRON HCL 4 MG PO TABS: 4.0000 mg | ORAL_TABLET | Freq: Four times a day (QID) | ORAL | Status: DC | PRN

## 2013-08-17 MED ORDER — ADULT MULTIVITAMIN W/MINERALS CH
1.0000 | ORAL_TABLET | Freq: Every day | ORAL | Status: DC
  Administered 2013-08-17 – 2013-08-19 (×3): 1 via ORAL
  Filled 2013-08-17 (×3): qty 1

## 2013-08-17 MED ORDER — HYDROMORPHONE HCL PF 1 MG/ML IJ SOLN
1.0000 mg | INTRAMUSCULAR | Status: DC | PRN
  Administered 2013-08-18: 1 mg via INTRAVENOUS
  Filled 2013-08-17: qty 1

## 2013-08-17 MED ORDER — LEVOFLOXACIN IN D5W 500 MG/100ML IV SOLN
500.0000 mg | INTRAVENOUS | Status: DC
  Administered 2013-08-17: 500 mg via INTRAVENOUS
  Filled 2013-08-17: qty 100

## 2013-08-17 MED ORDER — ALBUTEROL SULFATE (2.5 MG/3ML) 0.083% IN NEBU: 2.5000 mg | INHALATION_SOLUTION | Freq: Four times a day (QID) | RESPIRATORY_TRACT | Status: DC

## 2013-08-17 MED ORDER — ALBUTEROL SULFATE (2.5 MG/3ML) 0.083% IN NEBU
2.5000 mg | INHALATION_SOLUTION | RESPIRATORY_TRACT | Status: DC | PRN
  Administered 2013-08-17: 2.5 mg via RESPIRATORY_TRACT
  Filled 2013-08-17: qty 3

## 2013-08-17 MED ORDER — VITAMIN C 500 MG PO TABS
500.0000 mg | ORAL_TABLET | Freq: Every day | ORAL | Status: DC
  Administered 2013-08-17 – 2013-08-19 (×3): 500 mg via ORAL
  Filled 2013-08-17 (×3): qty 1

## 2013-08-17 MED ORDER — AMLODIPINE BESYLATE 10 MG PO TABS
10.0000 mg | ORAL_TABLET | Freq: Every day | ORAL | Status: DC
  Administered 2013-08-18 – 2013-08-19 (×2): 10 mg via ORAL
  Filled 2013-08-17 (×2): qty 1

## 2013-08-17 MED ORDER — SENNA 8.6 MG PO TABS
1.0000 | ORAL_TABLET | Freq: Two times a day (BID) | ORAL | Status: DC
  Administered 2013-08-17 – 2013-08-19 (×4): 8.6 mg via ORAL
  Filled 2013-08-17 (×4): qty 1

## 2013-08-17 MED ORDER — PREDNISONE 20 MG PO TABS
60.0000 mg | ORAL_TABLET | Freq: Once | ORAL | Status: AC
  Administered 2013-08-17: 60 mg via ORAL
  Filled 2013-08-17: qty 3

## 2013-08-17 MED ORDER — ONDANSETRON HCL 4 MG/2ML IJ SOLN: 4.0000 mg | Freq: Four times a day (QID) | INTRAMUSCULAR | Status: DC | PRN

## 2013-08-17 MED ORDER — ALBUTEROL SULFATE (2.5 MG/3ML) 0.083% IN NEBU
2.5000 mg | INHALATION_SOLUTION | Freq: Four times a day (QID) | RESPIRATORY_TRACT | Status: DC
  Administered 2013-08-17 – 2013-08-19 (×6): 2.5 mg via RESPIRATORY_TRACT
  Filled 2013-08-17 (×6): qty 3

## 2013-08-17 NOTE — ED Notes (Signed)
MD at bedside. 

## 2013-08-17 NOTE — H&P (Addendum)
[facility] History and Physical  [name] ACZ:[id number] DOB: 1940-11-16 DOA: 08/17/2013  Referring physician: ER physician PCP: Annye Asa, MD   Chief Complaint: shortness of breath   HPI:  Pt is 73 yo male with lung cancer diagnosed last year, follows with oncologist at [facility] in [place], last chemotherapy 2 weeks prior to this admission, now presenting to [facility] ED with main concern of several days duration of progressively worsening shortness of breath, associated with productive cough of clear and yellow sputum, occasionally bloody, poor oral intake. Pt reports progressive weight loss > 20 lbs in the past year, poor oral intake. He denies any nausea or vomiting, no specific urinary concern.   In ED, pt is hemodynamically stable, BP 124/53, HR in 60's and oxygen saturation 88 - 92%. CXR with progressive metastatic lung cancer. TR[facility] asked to admit for further evaluation. Pt was given albuterol nebulizer in ED, responded well, also given one tablet of Levaquin 500 mg PO, Prednisone 50 mg tablet PO.   Active Problems: Acute hypoxic respiratory failure - appears to be secondary to progressive metastatic lung cancer and ? PNA but not noted on CXR - unclear if possible PE, last CT angio chest in Feb 2015 negative for PE - Cr is slightly elevated so will have to obtain V/Q scan - will obtain sputum analysis, culture, urine legionella and strep pneumo - treat with empiric ABX Levaquin for now, BD's scheduled and as needed, Solumedrol - provide oxygen  Metastatic lung cancer - oncologist notified via EPIC order  Acute renal failure - likely pre renal in etiology -last Cr in Feb 2015 was WNL - provide IVF and repeat BMP in AM Anemia of chronic disease  - Hg slightly down from 12 back in Feb 2015 - pt denies any bleeding - repeat CBC in AM HTN - continue coreg and Norvasc as per home medical regimen  Severe malnutrition - secondary to progressive metastatic cancer - advance  diet as pt able to tolerate   Radiological Exams on Admission: Dg Chest 2 View   08/17/2013   Interval marked progression of the metastatic right lung cancer since the prior examinations from February, 2013, with interval enlargement of the dominant central right lower lobe lung mass extending into the right hilum, interval enlargement of multiple metastases in the right lung, development of a new lytic metastasis involving the right anterior 3rd rib, and development of a new small right pleural effusion.   EKG: Normal sinus rhythm, no ST/T wave changes  Code Status: Full Family Communication: Pt at bedside Disposition Plan: Admit for further evaluation  Robbie Lis, MD  [facility] Pager [phone]  Review of Systems:  Constitutional: Negative for diaphoresis.  HENT: Negative for hearing loss, ear pain, nosebleeds, congestion, sore throat, neck pain, tinnitus and ear discharge.   Eyes: Negative for blurred vision, double vision, photophobia, pain, discharge and redness.  Respiratory: Negative for wheezing and stridor.   Cardiovascular: Negative for claudication and leg swelling.  Gastrointestinal: Negative for heartburn, constipation, blood in stool and melena.  Genitourinary: Negative for dysuria, urgency, frequency, hematuria and flank pain.  Musculoskeletal: Negative for myalgias, back pain, joint pain and falls.  Skin: Negative for itching and rash.  Neurological: Negative for focal weakness, loss of consciousness and headaches.  Endo/Heme/Allergies: Negative for environmental allergies and polydipsia. Does not bruise/bleed easily.  Psychiatric/Behavioral: Negative for suicidal ideas. The patient is not nervous/anxious.      Past Medical History  Diagnosis Date  . Bronchitis   .  Glaucoma   . Heart disease   . Hyperlipidemia   . Hypertension   . CAD (coronary artery disease)     Remote cath ?1998.   . Tobacco abuse   . Cancer     lung and liver   Past Surgical  History  Procedure Laterality Date  . None    . Cardiac catheterization     Social History:  reports that he has quit smoking. He does not have any smokeless tobacco history on file. He reports that he does not drink alcohol or use illicit drugs.  No Known Allergies  Family History: no history of cancers, no cardiovascular diseases on mother or father side  Medication Sig  amLODipine (NORVASC) 10 MG tablet Take 10 mg by mouth daily.  aspirin EC 81 MG tablet Take 81 mg by mouth daily.  beclomethasone (QVAR) 80 MCG/ACT inhaler Inhale 1 puff into the lungs 2 (two) times daily.  carvedilol (COREG) 6.25 MG tablet Take 6.25 mg by mouth 2 (two) times daily with a meal.  clindamycin (CLEOCIN) 300 MG capsule Take 300 mg by mouth 3 (three) times daily. For 7 days  dexamethasone (DECADRON) 4 MG tablet Take 4 mg by mouth 2 (two) times daily. Starting the day before chemo through the day after chemo for 3 days.  dextromethorphan-guaiFENesin (MUCINEX DM) 30-600 MG per 12 hr tablet Take 1 tablet by mouth 2 (two) times daily as needed for cough.  docusate sodium (COLACE) 100 MG capsule Take 200 mg by mouth 2 (two) times daily.  folic acid (FOLVITE) 1 MG tablet Take 1 mg by mouth daily.  mirtazapine (REMERON) 15 MG tablet Take 15 mg by mouth at bedtime.  Multiple Vitamin (MULTIVITAMIN WITH MINERALS) TABS tablet Take 1 tablet by mouth daily. [employer] Gold vitamin supplement  ondansetron (ZOFRAN) 8 MG tablet Take 8 mg by mouth every 8 (eight) hours as needed for nausea or vomiting.  oxyCODONE (OXY IR/ROXICODONE) 5 MG immediate release tablet Take 5-15 mg by mouth every 4 (four) hours as needed for severe pain.   senna (SENOKOT) 8.6 MG TABS tablet Take 1 tablet by mouth 2 (two) times daily.   vitamin C (ASCORBIC ACID) 500 MG tablet Take 500 mg by mouth daily.  albuterol (PROVENTIL) (2.5 MG/3ML) 0.083% nebulizer solution Take 3 mLs (2.5 mg total) by nebulization every 6 (six) hours as needed for wheezing or  shortness of breath.  levofloxacin (LEVAQUIN) 500 MG tablet Take 1 tablet (500 mg total) by mouth daily.  predniSONE (DELTASONE) 10 MG tablet Take 2 tablets (20 mg total) by mouth daily.   Physical Exam: Filed Vitals:   08/17/13 1803 08/17/13 1830 08/17/13 1900 08/17/13 1924  BP:  127/60 124/53   Pulse:  60 59 66  Temp:      TempSrc:      Resp:  20 25 17   SpO2: 93% 87% 89% 98%    Physical Exam  Constitutional: Appears cachectic but not in acute distress  HENT: Normocephalic. External right and left ear normal. Dry MM Eyes: Conjunctivae and EOM are normal. PERRLA, no scleral icterus.  Neck: Normal ROM. Neck supple. No JVD. No tracheal deviation. No thyromegaly.  CVS: RRR, S1/S2 +, no murmurs, no gallops, no carotid bruit.  Pulmonary: Course breath sounds with rales bilaterally  Abdominal: Soft. BS +,  no distension, tenderness, rebound or guarding.  Musculoskeletal: Normal range of motion. No edema and no tenderness.  Lymphadenopathy: No lymphadenopathy noted, cervical, inguinal. Neuro: Alert. Normal reflexes, muscle tone coordination.  No cranial nerve deficit. Skin: Skin is warm and dry. No rash noted. Not diaphoretic. No erythema. No pallor.  Psychiatric: Normal mood and affect.   Labs on Admission:  Basic Metabolic Panel:  Recent Labs Lab 08/17/13 1742  NA 145  K 4.3  CL 104  CO2 32  GLUCOSE 118*  BUN 11  CREATININE 1.68*  CALCIUM 9.2   CBC:  Recent Labs Lab 08/17/13 1742  WBC 7.1  NEUTROABS 3.9  HGB 10.1*  HCT 30.9*  MCV 76.9*  PLT 542*   If 7PM-7AM, please contact night-coverage [email or web address] Password [email or web address] 08/17/2013, 9:00 PM

## 2013-08-17 NOTE — ED Provider Notes (Signed)
CSN: [id number]     Arrival date & time 08/17/13  1632 History   First MD Initiated Contact with Patient 08/17/13 1636     Chief Complaint  Patient presents with  . Shortness of Breath  . Cough  . Pleurisy      HPI  [name] presents with complaint of cough and shortness of breath for 2-3 weeks. Diagnosed with lung cancer last year. He is followed at the [facility] in [place]. He eyebrows chemotherapy until December. He developed a tooth abscess. His physician held his chemotherapy until he was able to get this tooth taken care of, however this was in late February. He reestablished with his oncologist. He got chemotherapy again proximally 2 weeks ago.   He has developed some pain under his right axilla that he states  feels like "might be lymph nodes ". He has a frequent cough. He feels short of breath almost every day. Not progressively dyspneic. No PND or orthopnea. He has fairly classic B symptoms of a 20 pound weight loss, night sweats, had hemoptysis a few weeks ago. None today. His appetite has been poor, however he states his physician gave him a vitamin and his appetite is improved. No nausea. No vomiting.   Past Medical History  Diagnosis Date  . Bronchitis   . Glaucoma   . Heart disease   . Hyperlipidemia   . Hypertension   . CAD (coronary artery disease)     Remote cath ?1998.   . Tobacco abuse   . Cancer     lung and liver   Past Surgical History  Procedure Laterality Date  . None    . Cardiac catheterization     Family History  Problem Relation Age of Onset  . Hyperlipidemia Mother   . Heart disease Mother   . Stroke Mother   . Hypertension Mother   . Kidney disease Mother   . Diabetes Mother   . Diabetes Father   . Hypertension Maternal Grandfather   . Hyperlipidemia Paternal Grandmother   . Diabetes Paternal Grandmother   . Heart attack Maternal Grandfather    History  Substance Use Topics  . Smoking status: Former Smoker -- 0.30 packs/day for 54 years  .  Smokeless tobacco: Not on file     Comment: smokes a lot less than he used to  . Alcohol Use: No    Review of Systems  Constitutional: Positive for appetite change and unexpected weight change. Negative for fever, chills, diaphoresis and fatigue.  HENT: Negative for mouth sores, sore throat and trouble swallowing.   Eyes: Negative for visual disturbance.  Respiratory: Positive for cough and shortness of breath. Negative for chest tightness and wheezing.   Cardiovascular: Negative for chest pain, palpitations and leg swelling.  Gastrointestinal: Negative for nausea, vomiting, abdominal pain, diarrhea and abdominal distention.  Endocrine: Negative for polydipsia, polyphagia and polyuria.  Genitourinary: Negative for dysuria, frequency and hematuria.  Musculoskeletal: Negative for gait problem.  Skin: Negative for color change, pallor and rash.  Neurological: Negative for dizziness, syncope, light-headedness and headaches.  Hematological: Does not bruise/bleed easily.  Psychiatric/Behavioral: Negative for behavioral problems and confusion.      Allergies  Review of patient's allergies indicates no known allergies.  Home Medications   Prior to Admission medications   Medication Sig Start Date End Date Taking? Authorizing Provider  amLODipine (NORVASC) 10 MG tablet Take 10 mg by mouth daily.   Yes Historical Provider, MD  aspirin EC 81 MG tablet Take  81 mg by mouth daily.   Yes Historical Provider, MD  beclomethasone (QVAR) 80 MCG/ACT inhaler Inhale 1 puff into the lungs 2 (two) times daily. 05/10/12  Yes Midge Minium, MD  carvedilol (COREG) 6.25 MG tablet Take 6.25 mg by mouth 2 (two) times daily with a meal.   Yes Historical Provider, MD  clindamycin (CLEOCIN) 300 MG capsule Take 300 mg by mouth 3 (three) times daily. For 7 days   Yes Historical Provider, MD  dexamethasone (DECADRON) 4 MG tablet Take 4 mg by mouth 2 (two) times daily. Starting the day before chemo through the day  after chemo for 3 days.   Yes Historical Provider, MD  dextromethorphan-guaiFENesin (MUCINEX DM) 30-600 MG per 12 hr tablet Take 1 tablet by mouth 2 (two) times daily as needed for cough.   Yes Historical Provider, MD  docusate sodium (COLACE) 100 MG capsule Take 200 mg by mouth 2 (two) times daily.   Yes Historical Provider, MD  folic acid (FOLVITE) 1 MG tablet Take 1 mg by mouth daily.   Yes Historical Provider, MD  mirtazapine (REMERON) 15 MG tablet Take 15 mg by mouth at bedtime.   Yes Historical Provider, MD  Multiple Vitamin (MULTIVITAMIN WITH MINERALS) TABS tablet Take 1 tablet by mouth daily. [employer] Gold vitamin supplement   Yes Historical Provider, MD  ondansetron (ZOFRAN) 8 MG tablet Take 8 mg by mouth every 8 (eight) hours as needed for nausea or vomiting.   Yes Historical Provider, MD  oxyCODONE (OXY IR/ROXICODONE) 5 MG immediate release tablet Take 5-15 mg by mouth every 4 (four) hours as needed for severe pain.    Yes Historical Provider, MD  senna (SENOKOT) 8.6 MG TABS tablet Take 1 tablet by mouth 2 (two) times daily.    Yes Historical Provider, MD  vitamin C (ASCORBIC ACID) 500 MG tablet Take 500 mg by mouth daily.   Yes Historical Provider, MD   BP 138/66  Pulse 60  Temp(Src) 97.8 F (36.6 C) (Oral)  Resp 20  Ht 5\' 10"  (1.778 m)  Wt 140 lb (63.504 kg)  BMI 20.09 kg/m2  SpO2 97% Physical Exam  Constitutional: He is oriented to person, place, and time. He appears well-developed and well-nourished. No distress.  Pleasant adult male. No distress. Speaks in full sentences.  HENT:  Head: Normocephalic.  Eyes: Conjunctivae are normal. Pupils are equal, round, and reactive to light. No scleral icterus.  Neck: Normal range of motion. Neck supple. No thyromegaly present.  Cardiovascular: Normal rate and regular rhythm.  Exam reveals no gallop and no friction rub.   No murmur heard. Pulmonary/Chest: Effort normal and breath sounds normal. No respiratory distress. He has no  wheezes. He has no rales.  Faint end expiratory wheezing. Mild scattered rhonchi. No palpable mass or adenopathy in the axilla where he reports his pain.  Abdominal: Soft. Bowel sounds are normal. He exhibits no distension. There is no tenderness. There is no rebound.  Musculoskeletal: Normal range of motion.  Neurological: He is alert and oriented to person, place, and time.  Skin: Skin is warm and dry. No rash noted.  Psychiatric: He has a normal mood and affect. His behavior is normal.    ED Course  Procedures (including critical care time) Labs Review Labs Reviewed  CBC WITH DIFFERENTIAL - Abnormal; Notable for the following:    RBC 4.02 (*)    Hemoglobin 10.1 (*)    HCT 30.9 (*)    MCV 76.9 (*)  MCH 25.1 (*)    RDW 17.5 (*)    Platelets 542 (*)    All other components within normal limits  BASIC METABOLIC PANEL - Abnormal; Notable for the following:    Glucose, Bld 118 (*)    Creatinine, Ser 1.68 (*)    GFR calc non Af Amer 39 (*)    GFR calc Af Amer 45 (*)    All other components within normal limits  BASIC METABOLIC PANEL - Abnormal; Notable for the following:    Glucose, Bld 221 (*)    GFR calc non Af Amer 54 (*)    GFR calc Af Amer 62 (*)    All other components within normal limits  CBC - Abnormal; Notable for the following:    WBC 3.9 (*)    RBC 3.94 (*)    Hemoglobin 9.9 (*)    HCT 30.2 (*)    MCV 76.6 (*)    MCH 25.1 (*)    RDW 17.4 (*)    Platelets 419 (*)    All other components within normal limits  CULTURE, EXPECTORATED SPUTUM-ASSESSMENT  LEGIONELLA ANTIGEN, URINE  STREP PNEUMONIAE URINARY ANTIGEN    Imaging Review Ct Angio Chest Pe W/cm &/or Wo Cm  08/18/2013   CLINICAL DATA:  Shortness of breath and chest pain. Metastatic lung cancer, currently undergoing chemotherapy  EXAM: CT ANGIOGRAPHY CHEST WITH CONTRAST  TECHNIQUE: Multidetector CT imaging of the chest was performed using the standard protocol during bolus administration of intravenous  contrast. Multiplanar CT image reconstructions and MIPs were obtained to evaluate the vascular anatomy.  CONTRAST:  124mL OMNIPAQUE IOHEXOL 300 MG/ML  SOLN  COMPARISON:  chest radiograph 08/17/2013, chest CT 05/17/2013  FINDINGS: The examination is adequate for evaluation for acute pulmonary embolism up to and including the 3rd order pulmonary arteries. No focal filling defect is identified up to and including the 3rd order pulmonary arteries to suggest acute pulmonary embolism. Great vessels are normal in caliber. Heart size is normal. No pericardial or left effusion. Trace right pleural effusion is noted with element of loculation.  Emphysematous changes are noted throughout both lungs. Mild central bronchial wall thickening is noted. No left-sided pulmonary nodule, mass, or consolidation is identified. Innumerable right pulmonary parenchymal and pleural parenchymal nodules are increased in size and number since previously, with dominant right middle lobe pulmonary mass now measuring 3.5 x 2.1 cm image 62, previously 2.9 x 1.1 cm at the same anatomic level. The dominant right perihilar mass with extension to the subcarinal region is larger, now measuring 7.9 x 4.5 cm image 55 compared to 5.5 x 2.8 cm previously at the same anatomic level. Left peribronchial node measures 0.8 cm image 46, previously 1.1 cm. 0.5 cm pretracheal node image 33 is state. No axillary lymphadenopathy. Thyroid is normal.  Incomplete imaging of the upper abdomen demonstrates incomplete visualization of the adrenal glands but no focal nodule in their visualized aspects.  Destruction of the right lateral third rib is reidentified, subjectively progressed since previously.  Review of the MIP images confirms the above findings.  IMPRESSION: Progression of right perihilar mass compatible with the given history of lung cancer with ipsilateral right hemithoracic pulmonary and bone metastatic disease.  No CT evidence for acute pulmonary embolism up  to and including the third order pulmonary arteries.  Trace right pleural effusion with element of mild loculation.   Electronically Signed   By: Conchita Paris M.D.   On: 08/18/2013 08:55     EKG Interpretation  Date/Time:  Sunday Aug 17 2013 16:46:53 EDT Ventricular Rate:  66 PR Interval:  180 QRS Duration: 110 QT Interval:  407 QTC Calculation: 426 R Axis:   -35 Text Interpretation:  Sinus rhythm Left axis deviation Slow R progression,  Unchanged. Confirmed by Jesse Rinks  MD, [place] ([id number]) on 08/17/2013 4:50:23 PM      MDM   Final diagnoses:  Lung cancer    Discussion normal limits. Afebrile. Calcium normal. His x-ray shows interval increase of multiple nodules in the right middle lower lobe and a new mass invading second &  third rib. No large effusion. The distal atelectasis as well. No obvious infiltrate. I feel his discomfort is axilla due to bony destruction of his ribs. He varies between 87 and 95% on room air. His given nebulized albuterol and instructed in use of a flutter mouth. He does produce a little more clear sputum and feels subjectively much better after use of the flutter valve in the albuterol.  Discussion not tachycardic. Do not feel this represents pulmonary embolus. He had normal CT angiogram within the last 2 weeks. Not tachycardic here. I think his hypoxemia is due to progression of his disease and associated atelectasis.    Jesse Furry, MD 08/19/13 [phone]

## 2013-08-17 NOTE — Discharge Instructions (Signed)
Wear the oxygen at home at 2 L. Do not adjust the oxygen level. Take medications as prescribed. Albuterol via nebulizer for wheezing. Use the flutter valve as instructed by respiratory therapist. Call your oncologist tomorrow to reinstitute her treatment for your cancer. Return to the emergency room with any worsening.   Lung Cancer Lung cancer is an abnormal growth of cells in one or both of your lungs. These extra cells may form a mass of tissue called a growth or tumor. Tumors can be either cancerous (malignant) or not cancerous (benign).  Lung cancer is the most common cause of cancer death in men and women. There are several different types of lung cancers. Usually, lung cancer is described as either small cell lung cancer or nonsmall cell lung cancer. Other types of cancer occur in the lungs, including carcinoid and cancers spread from other organs. The types of cancer have different behavior and treatment. RISK FACTORS Smoking is the most common risk factor for developing lung cancer. Other risk factors include:  Radon gas exposure.  Asbestos and other industrial substance exposure.  Second hand tobacco smoke.  Air pollution.  Family or personal history of lung cancer.  Age older than 54 years. CAUSES  Lung cancer usually starts when the lungs are exposed to harmful chemicals. Smoking is the most common risk factor for lung cancer. When you quit smoking, your risk of lung cancer falls each year (but is never the same as a person who has never smoked).  SYMPTOMS  Lung cancer may not have any symptoms in its early stages. The symptoms can depend on the type of cancer, its location, and other factors. Symptoms can include:  Cough (either new, different, or more severe).  Shortness of breath.  Coughing up blood (hemoptysis).  Chest pain.  Hoarseness.  Swelling of the face.  Drooping eyelid.  Changes in blood tests, such as low sodium (hyponatremia), high calcium  (hypercalcemia), or low blood count (anemia).  Weight loss. DIAGNOSIS  Your health care provider may suspect lung cancer based on your symptoms or based on tests obtained for other reasons. Tests or procedures used to find or confirm the presence of lung cancer may include:  Chest X-ray.  CT scan of the lungs and chest.  Blood tests.  Taking a tissue sample (biopsy) from your lung to look for cancer cells. Your cancer will be staged to determine its severity and extent. Staging is a careful attempt to find out the size of the tumor, whether the cancer has spread, and if so, to what parts of the body. You may need to have more tests to determine the stage of your cancer. The test results will help determine what treatment plan is best for you.   Stage 0 This is the earliest stage of lung cancer. In this stage the tumor is present in only a few layers of cells and has not grown beyond the inner lining of the lungs. Stage 0 (carcinoma in situ) is considered noninvasive, meaning at this stage it is not yet capable of spreading to other regions.  Stage I The cancer is located only in the lungs and not spread to any lymph nodes.  Stage II The cancer is in the lungs and the nearby lymph nodes.  Stage III The cancer is in the lungs and the lymph nodes in the middle of the chest. This is also called locally advanced disease. This stage has two subtypes:  Stage IIIa  The cancer has spread only  to lymph nodes on the same side of the chest where the cancer started.  Stage IIIb  The cancer has spread to lymph nodes on the opposite side of the chest or above the collar bone.  Stage IV This is the most advanced stage of lung cancer and is also called advanced disease. This stage describes when the cancer has spread to both lungs, the fluid in the area around the lungs, or to another body part. Your health care provider may tell you the detailed stage of your cancer, which includes both a number and a  letter.  TREATMENT  Depending on the type and stage, lung cancer may be treated with surgery, radiation therapy, chemotherapy, or targeted therapy. Some people have a combination of these therapies. Your treatment plan will be developed by your health care team.  [place] not smoke.  Only take over-the-counter or prescription medicines for pain, discomfort, or fever as directed by your health care provider.  Maintain a healthy diet.  Consider joining a support group. This may help you learn to cope with the stress of having lung cancer.  Seek advice to help you manage treatment side effects.  Keep all follow-up appointments as directed by your health care provider.  Inform your cancer specialist if you are admitted to the hospital. [place] IF:   You are losing weight without trying.  You have a persistent cough.  You feel short of breath.  You tire easily. SEEK IMMEDIATE MEDICAL CARE IF:   You cough up clotted blood or bright red blood.  Your pain is not manageable or controlled by medicine.  You develop new difficulty breathing or chest pain.  You develop swelling in one or both ankles or legs, or swelling in your face or neck.  You develop headache or confusion. Document Released: 07/03/2000 Document Revised: 01/15/2013 Document Reviewed: 11/13/2012 [facility] Patient Information 2014 [place], [place].

## 2013-08-17 NOTE — ED Notes (Signed)
RRT PRESENT AT BS

## 2013-08-17 NOTE — ED Notes (Signed)
Meds pending.

## 2013-08-17 NOTE — ED Notes (Signed)
Pt has congested cough and shortness of breath for 2 -3 weeks.  Pt with hx of lung cancer.  Pt states difficulty breathing and chest tightness when coughing.

## 2013-08-18 ENCOUNTER — Encounter (HOSPITAL_COMMUNITY): Payer: Self-pay

## 2013-08-18 ENCOUNTER — Inpatient Hospital Stay (HOSPITAL_COMMUNITY): Payer: Non-veteran care

## 2013-08-18 DIAGNOSIS — J449 Chronic obstructive pulmonary disease, unspecified: Secondary | ICD-10-CM

## 2013-08-18 DIAGNOSIS — E43 Unspecified severe protein-calorie malnutrition: Secondary | ICD-10-CM | POA: Insufficient documentation

## 2013-08-18 DIAGNOSIS — J9600 Acute respiratory failure, unspecified whether with hypoxia or hypercapnia: Secondary | ICD-10-CM

## 2013-08-18 LAB — BASIC METABOLIC PANEL
BUN: 13 mg/dL (ref 6–23)
CO2: 25 mEq/L (ref 19–32)
CREATININE: 1.29 mg/dL (ref 0.50–1.35)
Calcium: 8.9 mg/dL (ref 8.4–10.5)
Chloride: 105 mEq/L (ref 96–112)
GFR calc non Af Amer: 54 mL/min — ABNORMAL LOW (ref >90)
GFR, EST AFRICAN AMERICAN: 62 mL/min — AB (ref >90)
Glucose, Bld: 221 mg/dL — ABNORMAL HIGH (ref 70–99)
Potassium: 4.7 mEq/L (ref 3.7–5.3)
Sodium: 140 mEq/L (ref 137–147)

## 2013-08-18 LAB — CBC
HCT: 30.2 % — ABNORMAL LOW (ref 39.0–52.0)
Hemoglobin: 9.9 g/dL — ABNORMAL LOW (ref 13.0–17.0)
MCH: 25.1 pg — ABNORMAL LOW (ref 26.0–34.0)
MCHC: 32.8 g/dL (ref 30.0–36.0)
MCV: 76.6 fL — ABNORMAL LOW (ref 78.0–100.0)
Platelets: 419 10*3/uL — ABNORMAL HIGH (ref 150–400)
RBC: 3.94 MIL/uL — ABNORMAL LOW (ref 4.22–5.81)
RDW: 17.4 % — AB (ref 11.5–15.5)
WBC: 3.9 10*3/uL — AB (ref 4.0–10.5)

## 2013-08-18 LAB — STREP PNEUMONIAE URINARY ANTIGEN: STREP PNEUMO URINARY ANTIGEN: NEGATIVE

## 2013-08-18 MED ORDER — LEVOFLOXACIN IN D5W 750 MG/150ML IV SOLN: 750.0000 mg | INTRAVENOUS | Status: DC

## 2013-08-18 MED ORDER — ENSURE COMPLETE PO LIQD
237.0000 mL | Freq: Three times a day (TID) | ORAL | Status: DC
  Administered 2013-08-18 – 2013-08-19 (×2): 237 mL via ORAL

## 2013-08-18 MED ORDER — IOHEXOL 300 MG/ML  SOLN
100.0000 mL | Freq: Once | INTRAMUSCULAR | Status: AC | PRN
  Administered 2013-08-18: 100 mL via INTRAVENOUS

## 2013-08-18 MED ORDER — FLUTICASONE PROPIONATE HFA 44 MCG/ACT IN AERO
1.0000 | INHALATION_SPRAY | Freq: Two times a day (BID) | RESPIRATORY_TRACT | Status: DC
  Administered 2013-08-18 – 2013-08-19 (×2): 1 via RESPIRATORY_TRACT
  Filled 2013-08-18: qty 10.6

## 2013-08-18 NOTE — Care Management Note (Unsigned)
    Page 1 of 1   08/18/2013     2:24:20 PM CARE MANAGEMENT NOTE 08/18/2013  Patient:  [name], [name]   Account Number:  [id number]  Date Initiated:  08/18/2013  Documentation initiated by:  [facility]  Subjective/Objective Assessment:   73 year old male admitted with respiratory failure.     Action/Plan:   From home. Awaitning ambulatory sats to be done to help determine if he neds home oxygen,   Anticipated DC Date:  08/21/2013   Anticipated DC Plan:  [place]  CM consult      Choice offered to / List presented to:             Status of service:  In process, will continue to follow Medicare Important Message given?   (If response is "NO", the following Medicare IM given date fields will be blank) Date Medicare IM given:   Date Additional Medicare IM given:    Discharge Disposition:    Per UR Regulation:  Reviewed for med. necessity/level of care/duration of stay  If discussed at [place] of Stay Meetings, dates discussed:    Comments:

## 2013-08-18 NOTE — Progress Notes (Signed)
INITIAL NUTRITION ASSESSMENT  Pt meets criteria for severe MALNUTRITION in the context of chronic illness as evidenced by 20.4% weight loss in the past year with severe muscle wasting and subcutaneous fat loss in clavicles with <75% estimated energy intake in the past month.  DOCUMENTATION CODES Per approved criteria  -Severe malnutrition in the context of chronic illness   INTERVENTION: - Ensure Complete TID - Encouraged continued excellent meal intake - Will continue to monitor   NUTRITION DIAGNOSIS: Increased nutrient needs related to metastatic lung CA as evidenced by MD notes.   Goal: Pt to consume >90% of meals/supplements  Monitor:  Weights, labs, intake  Reason for Assessment: Malnutrition screening tool   73 y.o. male  Admitting Dx: Shortness of breath   ASSESSMENT: Pt with metastatic lung cancer diagnosed last year, follows with oncologist at [facility] in [place], last chemotherapy 2 weeks prior to this admission, now presenting to [facility] ED with main concern of several days duration of progressively worsening shortness of breath, associated with productive cough of clear and yellow sputum, occasionally bloody, poor oral intake. Pt reports progressive weight loss > 20 lbs in the past year, poor oral intake. Found to have acute hypoxic respiratory failure.   Met with pt who reports weighing 176 pounds in February 2014 at time of cancer diagnosis, now weighs 140 pounds. Was eating 2-3 meals/day that he tried to make well balanced however admits his intake was low in vegetables. Started drinking Ensure last week as his appetite was down then. States as his disease has progressed, his appetite has worsened. States he ate all of his breakfast this morning.   Nutrition Focused Physical Exam:  Subcutaneous Fat:  Orbital Region: moderate fat loss  Upper Arm Region: moderate fat loss Thoracic and Lumbar Region: NA  Muscle:  Temple Region: moderate fat loss Clavicle Bone Region: severe  muscle wasting and fat loss Clavicle and Acromion Bone Region: severe muscle wasting and fat loss Scapular Bone Region: NA Dorsal Hand: moderate fat loss Patellar Region: moderate fat loss Anterior Thigh Region: moderate fat loss Posterior Calf Region: moderate fat loss  Edema: None noted     Height: Ht Readings from Last 1 Encounters:  08/18/13 _0  (1.778 m)    Weight: Wt Readings from Last 1 Encounters:  08/18/13 140 lb (63.504 kg)    Ideal Body Weight: 166 lbs  % Ideal Body Weight: 84%  Wt Readings from Last 10 Encounters:  08/18/13 140 lb (63.504 kg)  05/16/13 130 lb (58.968 kg)  12/19/12 170 lb (77.111 kg)  05/10/12 170 lb 12.8 oz (77.474 kg)  10/10/11 172 lb 3.2 oz (78.109 kg)  07/11/11 172 lb 6.4 oz (78.2 kg)  06/12/11 170 lb (77.111 kg)  05/30/11 168 lb 12.8 oz (76.567 kg)    Usual Body Weight: 176 lbs   % Usual Body Weight: 80%  BMI:  Body mass index is 20.09 kg/(m^2).  Estimated Nutritional Needs: Kcal: 1800-2000 Protein: 80-100g Fluid: 1.8-2L/day   Skin: Intact   Diet Order: General  EDUCATION NEEDS: -No education needs identified at this time   Intake/Output Summary (Last 24 hours) at 08/18/13 1048 Last data filed at 08/18/13 0931  Gross per 24 hour  Intake    360 ml  Output    480 ml  Net   -120 ml    Last BM: 5/10  Labs:   Recent Labs Lab 08/17/13 1742 08/18/13 0439  NA 145 140  K 4.3 4.7  CL 104 105  CO2  32 25  BUN 11 13  CREATININE 1.68* 1.29  CALCIUM 9.2 8.9  GLUCOSE 118* 221*    CBG (last 3)  No results found for this basename: GLUCAP,  in the last 72 hours  Scheduled Meds: . albuterol  2.5 mg Nebulization QID  . amLODipine  10 mg Oral Daily  . aspirin EC  81 mg Oral Daily  . beclomethasone  1 puff Inhalation BID  . carvedilol  6.25 mg Oral BID WC  . docusate sodium  200 mg Oral BID  . enoxaparin (LOVENOX) injection  40 mg Subcutaneous Q24H  . folic acid  1 mg Oral Daily  . mirtazapine  15 mg Oral QHS   . multivitamin with minerals  1 tablet Oral Daily  . senna  1 tablet Oral BID  . sodium chloride  3 mL Intravenous Q12H  . vitamin C  500 mg Oral Daily    Continuous Infusions: . sodium chloride 50 mL/hr at 08/17/13 2133    Past Medical History  Diagnosis Date  . Bronchitis   . Glaucoma   . Heart disease   . Hyperlipidemia   . Hypertension   . CAD (coronary artery disease)     Remote cath ?1998.   . Tobacco abuse   . Cancer     lung and liver    Past Surgical History  Procedure Laterality Date  . None    . Cardiac catheterization      Mikey College MS, [place], [place] Pager [phone] After Hours Pager

## 2013-08-18 NOTE — Progress Notes (Signed)
[facility] PROGRESS NOTE  [name] YWV:[id number] DOB: 07/25/1940 DOA: 08/17/2013 PCP: Annye Asa, MD  Assessment/Plan: Acute hypoxic respiratory failure  - appears to be secondary to progressive metastatic lung cancer based on CXR -will check CTA chest to r/o PE - will obtain sputum analysis, culture, urine legionella and strep pneumo  - stop levaquin/solumedrol, no indication of pneumonia/COPD exacerbation - check Ambulatory O2 sats  Metastatic lung cancer  - followed by Onc at [facility]  Acute renal failure  - likely pre renal in etiology  -improved, continue IVF  Anemia of chronic disease  - due to malignancy/chemo -monitor, no indication for transfusion  HTN  - continue coreg and Norvasc   Severe malnutrition  - secondary to progressive metastatic cancer  - RD consult  COPD  -stable, nebs PRN  DVT proph: lovenox  Code Status: Full Code Family Communication: none at bedside Disposition Plan: Home later today or in am   HPI/Subjective: Breathing better  Objective: Filed Vitals:   08/18/13 0500  BP: 127/57  Pulse: 54  Temp: 97.5 F (36.4 C)  Resp: 18    Intake/Output Summary (Last 24 hours) at 08/18/13 1019 Last data filed at 08/18/13 0931  Gross per 24 hour  Intake    360 ml  Output    480 ml  Net   -120 ml   Filed Weights   08/18/13 0009  Weight: 63.504 kg (140 lb)    Exam:   General:  AAOx3, no distress  Cardiovascular: S1S2/RRR  Respiratory: good air movement  Abdomen: soft, Nt, BS present  Musculoskeletal: no edema c/c  Data Reviewed: Basic Metabolic Panel:  Recent Labs Lab 08/17/13 1742 08/18/13 0439  NA 145 140  K 4.3 4.7  CL 104 105  CO2 32 25  GLUCOSE 118* 221*  BUN 11 13  CREATININE 1.68* 1.29  CALCIUM 9.2 8.9   Liver Function Tests: No results found for this basename: AST, ALT, ALKPHOS, BILITOT, PROT, ALBUMIN,  in the last 168 hours No results found for this basename: LIPASE, AMYLASE,  in the  last 168 hours No results found for this basename: AMMONIA,  in the last 168 hours CBC:  Recent Labs Lab 08/17/13 1742 08/18/13 0439  WBC 7.1 3.9*  NEUTROABS 3.9  --   HGB 10.1* 9.9*  HCT 30.9* 30.2*  MCV 76.9* 76.6*  PLT 542* 419*   Cardiac Enzymes: No results found for this basename: CKTOTAL, CKMB, CKMBINDEX, TROPONINI,  in the last 168 hours BNP (last 3 results)  Recent Labs  05/16/13 2228  PROBNP 88.2   CBG: No results found for this basename: GLUCAP,  in the last 168 hours  No results found for this or any previous visit (from the past 240 hour(s)).   Studies: Dg Chest 2 View  08/17/2013   CLINICAL DATA:  Shortness of breath. Current history of metastatic lung cancer.  EXAM: CHEST  2 VIEW  COMPARISON:  CT ANGIO CHEST W/CM &/OR WO/CM dated 05/17/2013; DG CHEST 1V PORT dated 05/16/2013; DG CHEST 2 VIEW dated 04/04/2012; DG CHEST 2 VIEW dated 01/02/2010  FINDINGS: Interval increase in size of multiple nodules in the right middle and lower lobe and interval increase in size in the dominant mass in the right lower lobe extending into the right hilum since the prior examinations. Small right pleural effusion, new since the prior examinations. No other new findings in the lungs. Cardiac silhouette normal in size, unchanged. Thoracic aorta mildly atherosclerotic, unchanged. Interval development of a lytic  metastasis involving the right lateral 3rd rib.  IMPRESSION: Interval marked progression of the metastatic right lung cancer since the prior examinations from February, 2013, with interval enlargement of the dominant central right lower lobe lung mass extending into the right hilum, interval enlargement of multiple metastases in the right lung, development of a new lytic metastasis involving the right anterior 3rd rib, and development of a new small right pleural effusion.   Electronically Signed   By: Evangeline Dakin M.D.   On: 08/17/2013 17:05   Ct Angio Chest Pe W/cm &/or Wo  Cm  08/18/2013   CLINICAL DATA:  Shortness of breath and chest pain. Metastatic lung cancer, currently undergoing chemotherapy  EXAM: CT ANGIOGRAPHY CHEST WITH CONTRAST  TECHNIQUE: Multidetector CT imaging of the chest was performed using the standard protocol during bolus administration of intravenous contrast. Multiplanar CT image reconstructions and MIPs were obtained to evaluate the vascular anatomy.  CONTRAST:  12mL OMNIPAQUE IOHEXOL 300 MG/ML  SOLN  COMPARISON:  chest radiograph 08/17/2013, chest CT 05/17/2013  FINDINGS: The examination is adequate for evaluation for acute pulmonary embolism up to and including the 3rd order pulmonary arteries. No focal filling defect is identified up to and including the 3rd order pulmonary arteries to suggest acute pulmonary embolism. Great vessels are normal in caliber. Heart size is normal. No pericardial or left effusion. Trace right pleural effusion is noted with element of loculation.  Emphysematous changes are noted throughout both lungs. Mild central bronchial wall thickening is noted. No left-sided pulmonary nodule, mass, or consolidation is identified. Innumerable right pulmonary parenchymal and pleural parenchymal nodules are increased in size and number since previously, with dominant right middle lobe pulmonary mass now measuring 3.5 x 2.1 cm image 62, previously 2.9 x 1.1 cm at the same anatomic level. The dominant right perihilar mass with extension to the subcarinal region is larger, now measuring 7.9 x 4.5 cm image 55 compared to 5.5 x 2.8 cm previously at the same anatomic level. Left peribronchial node measures 0.8 cm image 46, previously 1.1 cm. 0.5 cm pretracheal node image 33 is state. No axillary lymphadenopathy. Thyroid is normal.  Incomplete imaging of the upper abdomen demonstrates incomplete visualization of the adrenal glands but no focal nodule in their visualized aspects.  Destruction of the right lateral third rib is reidentified, subjectively  progressed since previously.  Review of the MIP images confirms the above findings.  IMPRESSION: Progression of right perihilar mass compatible with the given history of lung cancer with ipsilateral right hemithoracic pulmonary and bone metastatic disease.  No CT evidence for acute pulmonary embolism up to and including the third order pulmonary arteries.  Trace right pleural effusion with element of mild loculation.   Electronically Signed   By: Conchita Paris M.D.   On: 08/18/2013 08:55    Scheduled Meds: . albuterol  2.5 mg Nebulization QID  . amLODipine  10 mg Oral Daily  . aspirin EC  81 mg Oral Daily  . beclomethasone  1 puff Inhalation BID  . carvedilol  6.25 mg Oral BID WC  . docusate sodium  200 mg Oral BID  . enoxaparin (LOVENOX) injection  40 mg Subcutaneous Q24H  . folic acid  1 mg Oral Daily  . [START ON 08/19/2013] levofloxacin (LEVAQUIN) IV  750 mg Intravenous Q48H  . methylPREDNISolone (SOLU-MEDROL) injection  40 mg Intravenous Q6H  . mirtazapine  15 mg Oral QHS  . multivitamin with minerals  1 tablet Oral Daily  . senna  1  tablet Oral BID  . sodium chloride  3 mL Intravenous Q12H  . vitamin C  500 mg Oral Daily   Continuous Infusions: . sodium chloride 50 mL/hr at 08/17/13 2133   Antibiotics Given (last 72 hours)   Date/Time Action Medication Dose   08/17/13 1734 Given   levofloxacin (LEVAQUIN) tablet 500 mg 500 mg      Active Problems:   Acute respiratory failure    Time spent: 20min    [facility] [facility] Pager [phone]. If 7PM-7AM, please contact night-coverage at [email or web address], password [email or web address] 08/18/2013, 10:19 AM  LOS: 1 day

## 2013-08-18 NOTE — Progress Notes (Signed)
Clinical Social Work  CSW received inappropriate referral for assistance with home oxygen. CSW informed CM of possible needs. CSW is signing off but available if needed.  [place], [place] [phone]

## 2013-08-18 NOTE — Progress Notes (Signed)
SATURATION QUALIFICATIONS: (This note is used to comply with regulatory documentation for home oxygen)  Patient Saturations on Room Air at Rest = 97%  Patient Saturations on Room Air while Ambulating = 91 to 99%  Patient Saturations on 0 Liters of oxygen while Ambulating = %  Please briefly explain why patient needs home oxygen:

## 2013-08-19 DIAGNOSIS — I10 Essential (primary) hypertension: Secondary | ICD-10-CM

## 2013-08-19 LAB — LEGIONELLA ANTIGEN, URINE: LEGIONELLA ANTIGEN, URINE: NEGATIVE

## 2013-08-19 MED ORDER — ALBUTEROL SULFATE HFA 108 (90 BASE) MCG/ACT IN AERS: 2.0000 | INHALATION_SPRAY | Freq: Four times a day (QID) | RESPIRATORY_TRACT | Status: AC | PRN

## 2013-08-19 NOTE — Progress Notes (Signed)
Patient discharge home with wife, alert and oriented, discharge instructions given, patient verbalize understanding of discharge instructions given, My Chart access declined at this time will access at a later time, patient in stable condition at this time

## 2013-08-27 NOTE — Discharge Summary (Signed)
Physician Discharge Summary  [name] NOB:[id number] DOB: 01-12-1941 DOA: 08/17/2013  PCP: Annye Asa, MD  Admit date: 08/17/2013 Discharge date: 08/19/2013  Time spent: 45 minutes  Recommendations for Outpatient Follow-up:  1. PCP in 1 week 2. FU with Oncology at [facility], he has FU this month  Discharge Diagnoses:  Active Problems:   Acute respiratory failure   Protein-calorie malnutrition, severe   Metastatic lung Cancer    Acute kidney injury   Anemia of chronic disease   HTN   Severe malnutrition   COPD  Discharge Condition: stable  Diet recommendation: regular  Filed Weights   08/18/13 0009  Weight: 63.504 kg (140 lb)    History of present illness:  Pt is 73 yo male with lung cancer diagnosed last year, follows with oncologist at [facility] in [place], last chemotherapy 2 weeks prior to this admission, now presenting to [facility] ED with main concern of several days duration of progressively worsening shortness of breath, associated with productive cough of clear and yellow sputum, occasionally bloody, poor oral intake. Pt reports progressive weight loss > 20 lbs in the past year, poor oral intake. He denies any nausea or vomiting, no specific urinary concern.  In ED, pt is hemodynamically stable, BP 124/53, HR in 60's and oxygen saturation 88 - 92%. CXR with progressive metastatic lung cancer. TR[facility] asked to admit for further evaluation. Pt was given albuterol nebulizer in ED, responded well, also given one tablet of Levaquin 500 mg PO, Prednisone 50 mg tablet PO.   Hospital Course:  Acute hypoxic respiratory failure  - appears to be secondary to progressive metastatic lung cancer based on CXR  -CTA chest to r/o PE negative for PE or pneumonia, showed progressive lung cancer - clinically stable and improved by next day and not hypoxic on room air or with activity -needs FU with his oncologist at [facility] -has appt this month  Metastatic lung cancer  - followed by Onc at [facility]    Acute renal failure  - likely pre renal in etiology  -improved with IVF  Anemia of chronic disease  - due to malignancy/chemo  - no indication for transfusion   HTN  - continue coreg and Norvasc   Severe malnutrition  - secondary to progressive metastatic cancer  - RD consulted, advised supplements, ensure   COPD  -stable, nebs PRN  Discharge Exam: Filed Vitals:   08/19/13 0900  BP: 138/66  Pulse: 60  Temp: 97.8 F (36.6 C)  Resp: 20    General: AAOx3 Cardiovascular: S1S2/RRR Respiratory: CTAB  Discharge Instructions You were cared for by a hospitalist during your hospital stay. If you have any questions about your discharge medications or the care you received while you were in the hospital after you are discharged, you can call the unit and asked to speak with the hospitalist on call if the hospitalist that took care of you is not available. Once you are discharged, your primary care physician will handle any further medical issues. Please note that NO REFILLS for any discharge medications will be authorized once you are discharged, as it is imperative that you return to your primary care physician (or establish a relationship with a primary care physician if you do not have one) for your aftercare needs so that they can reassess your need for medications and monitor your lab values.  Discharge Instructions   Diet - low sodium heart healthy    Complete by:  As directed      Increase  activity slowly    Complete by:  As directed             Medication List    STOP taking these medications       clindamycin 300 MG capsule  Commonly known as:  CLEOCIN      TAKE these medications       albuterol 108 (90 BASE) MCG/ACT inhaler  Commonly known as:  PROVENTIL HFA;VENTOLIN HFA  Inhale 2 puffs into the lungs every 6 (six) hours as needed for wheezing or shortness of breath.     amLODipine 10 MG tablet  Commonly known as:  NORVASC  Take 10 mg by mouth daily.      aspirin EC 81 MG tablet  Take 81 mg by mouth daily.     beclomethasone 80 MCG/ACT inhaler  Commonly known as:  QVAR  Inhale 1 puff into the lungs 2 (two) times daily.     carvedilol 6.25 MG tablet  Commonly known as:  COREG  Take 6.25 mg by mouth 2 (two) times daily with a meal.     dexamethasone 4 MG tablet  Commonly known as:  DECADRON  Take 4 mg by mouth 2 (two) times daily. Starting the day before chemo through the day after chemo for 3 days.     dextromethorphan-guaiFENesin 30-600 MG per 12 hr tablet  Commonly known as:  MUCINEX DM  Take 1 tablet by mouth 2 (two) times daily as needed for cough.     docusate sodium 100 MG capsule  Commonly known as:  COLACE  Take 200 mg by mouth 2 (two) times daily.     folic acid 1 MG tablet  Commonly known as:  FOLVITE  Take 1 mg by mouth daily.     mirtazapine 15 MG tablet  Commonly known as:  REMERON  Take 15 mg by mouth at bedtime.     multivitamin with minerals Tabs tablet  Take 1 tablet by mouth daily. Inner Man Gold vitamin supplement     ondansetron 8 MG tablet  Commonly known as:  ZOFRAN  Take 8 mg by mouth every 8 (eight) hours as needed for nausea or vomiting.     oxyCODONE 5 MG immediate release tablet  Commonly known as:  Oxy IR/ROXICODONE  Take 5-15 mg by mouth every 4 (four) hours as needed for severe pain.     senna 8.6 MG Tabs tablet  Commonly known as:  SENOKOT  Take 1 tablet by mouth 2 (two) times daily.     vitamin C 500 MG tablet  Commonly known as:  ASCORBIC ACID  Take 500 mg by mouth daily.       No Known Allergies     Follow-up Information   Follow up with Oncologist(Cancer doctor). (in [facility] tomorrow)        The results of significant diagnostics from this hospitalization (including imaging, microbiology, ancillary and laboratory) are listed below for reference.    Significant Diagnostic Studies: Dg Chest 2 View  08/17/2013   CLINICAL DATA:  Shortness of breath. Current history of  metastatic lung cancer.  EXAM: CHEST  2 VIEW  COMPARISON:  CT ANGIO CHEST W/CM &/OR WO/CM dated 05/17/2013; DG CHEST 1V PORT dated 05/16/2013; DG CHEST 2 VIEW dated 04/04/2012; DG CHEST 2 VIEW dated 01/02/2010  FINDINGS: Interval increase in size of multiple nodules in the right middle and lower lobe and interval increase in size in the dominant mass in the right lower lobe extending into the right  hilum since the prior examinations. Small right pleural effusion, new since the prior examinations. No other new findings in the lungs. Cardiac silhouette normal in size, unchanged. Thoracic aorta mildly atherosclerotic, unchanged. Interval development of a lytic metastasis involving the right lateral 3rd rib.  IMPRESSION: Interval marked progression of the metastatic right lung cancer since the prior examinations from February, 2013, with interval enlargement of the dominant central right lower lobe lung mass extending into the right hilum, interval enlargement of multiple metastases in the right lung, development of a new lytic metastasis involving the right anterior 3rd rib, and development of a new small right pleural effusion.   Electronically Signed   By: Evangeline Dakin M.D.   On: 08/17/2013 17:05   Ct Angio Chest Pe W/cm &/or Wo Cm  08/18/2013   CLINICAL DATA:  Shortness of breath and chest pain. Metastatic lung cancer, currently undergoing chemotherapy  EXAM: CT ANGIOGRAPHY CHEST WITH CONTRAST  TECHNIQUE: Multidetector CT imaging of the chest was performed using the standard protocol during bolus administration of intravenous contrast. Multiplanar CT image reconstructions and MIPs were obtained to evaluate the vascular anatomy.  CONTRAST:  150mL OMNIPAQUE IOHEXOL 300 MG/ML  SOLN  COMPARISON:  chest radiograph 08/17/2013, chest CT 05/17/2013  FINDINGS: The examination is adequate for evaluation for acute pulmonary embolism up to and including the 3rd order pulmonary arteries. No focal filling defect is identified  up to and including the 3rd order pulmonary arteries to suggest acute pulmonary embolism. Great vessels are normal in caliber. Heart size is normal. No pericardial or left effusion. Trace right pleural effusion is noted with element of loculation.  Emphysematous changes are noted throughout both lungs. Mild central bronchial wall thickening is noted. No left-sided pulmonary nodule, mass, or consolidation is identified. Innumerable right pulmonary parenchymal and pleural parenchymal nodules are increased in size and number since previously, with dominant right middle lobe pulmonary mass now measuring 3.5 x 2.1 cm image 62, previously 2.9 x 1.1 cm at the same anatomic level. The dominant right perihilar mass with extension to the subcarinal region is larger, now measuring 7.9 x 4.5 cm image 55 compared to 5.5 x 2.8 cm previously at the same anatomic level. Left peribronchial node measures 0.8 cm image 46, previously 1.1 cm. 0.5 cm pretracheal node image 33 is state. No axillary lymphadenopathy. Thyroid is normal.  Incomplete imaging of the upper abdomen demonstrates incomplete visualization of the adrenal glands but no focal nodule in their visualized aspects.  Destruction of the right lateral third rib is reidentified, subjectively progressed since previously.  Review of the MIP images confirms the above findings.  IMPRESSION: Progression of right perihilar mass compatible with the given history of lung cancer with ipsilateral right hemithoracic pulmonary and bone metastatic disease.  No CT evidence for acute pulmonary embolism up to and including the third order pulmonary arteries.  Trace right pleural effusion with element of mild loculation.   Electronically Signed   By: Conchita Paris M.D.   On: 08/18/2013 08:55    Microbiology: No results found for this or any previous visit (from the past 240 hour(s)).   Labs: Basic Metabolic Panel: No results found for this basename: NA, K, CL, CO2, GLUCOSE, BUN,  CREATININE, CALCIUM, MG, PHOS,  in the last 168 hours Liver Function Tests: No results found for this basename: AST, ALT, ALKPHOS, BILITOT, PROT, ALBUMIN,  in the last 168 hours No results found for this basename: LIPASE, AMYLASE,  in the last 168 hours No results  found for this basename: AMMONIA,  in the last 168 hours CBC: No results found for this basename: WBC, NEUTROABS, HGB, HCT, MCV, PLT,  in the last 168 hours Cardiac Enzymes: No results found for this basename: CKTOTAL, CKMB, CKMBINDEX, TROPONINI,  in the last 168 hours BNP: BNP (last 3 results)  Recent Labs  05/16/13 2228  PROBNP 88.2   CBG: No results found for this basename: GLUCAP,  in the last 168 hours     Signed:  [place] 08/27/2013, 4:31 PM

## 2013-12-17 ENCOUNTER — Emergency Department (HOSPITAL_COMMUNITY): Payer: Medicare Other

## 2013-12-17 ENCOUNTER — Encounter (HOSPITAL_COMMUNITY): Payer: Self-pay | Admitting: Emergency Medicine

## 2013-12-17 ENCOUNTER — Emergency Department (HOSPITAL_COMMUNITY)
Admission: EM | Admit: 2013-12-17 | Discharge: 2013-12-17 | Disposition: A | Discharge status: Home / Self Care | Payer: Medicare Other | Attending: Emergency Medicine | Admitting: Emergency Medicine

## 2013-12-17 DIAGNOSIS — Z862 Personal history of diseases of the blood and blood-forming organs and certain disorders involving the immune mechanism: Secondary | ICD-10-CM | POA: Diagnosis not present

## 2013-12-17 DIAGNOSIS — Z7982 Long term (current) use of aspirin: Secondary | ICD-10-CM | POA: Insufficient documentation

## 2013-12-17 DIAGNOSIS — I10 Essential (primary) hypertension: Secondary | ICD-10-CM | POA: Diagnosis not present

## 2013-12-17 DIAGNOSIS — Z8505 Personal history of malignant neoplasm of liver: Secondary | ICD-10-CM | POA: Insufficient documentation

## 2013-12-17 DIAGNOSIS — Z9889 Other specified postprocedural states: Secondary | ICD-10-CM | POA: Diagnosis not present

## 2013-12-17 DIAGNOSIS — C3491 Malignant neoplasm of unspecified part of right bronchus or lung: Secondary | ICD-10-CM

## 2013-12-17 DIAGNOSIS — Z8639 Personal history of other endocrine, nutritional and metabolic disease: Secondary | ICD-10-CM | POA: Insufficient documentation

## 2013-12-17 DIAGNOSIS — IMO0002 Reserved for concepts with insufficient information to code with codable children: Secondary | ICD-10-CM | POA: Insufficient documentation

## 2013-12-17 DIAGNOSIS — Z8709 Personal history of other diseases of the respiratory system: Secondary | ICD-10-CM | POA: Insufficient documentation

## 2013-12-17 DIAGNOSIS — R0602 Shortness of breath: Secondary | ICD-10-CM | POA: Diagnosis present

## 2013-12-17 DIAGNOSIS — Z9981 Dependence on supplemental oxygen: Secondary | ICD-10-CM | POA: Insufficient documentation

## 2013-12-17 DIAGNOSIS — C3490 Malignant neoplasm of unspecified part of unspecified bronchus or lung: Secondary | ICD-10-CM | POA: Diagnosis not present

## 2013-12-17 DIAGNOSIS — I2510 Atherosclerotic heart disease of native coronary artery without angina pectoris: Secondary | ICD-10-CM | POA: Insufficient documentation

## 2013-12-17 DIAGNOSIS — D649 Anemia, unspecified: Secondary | ICD-10-CM

## 2013-12-17 DIAGNOSIS — Z87891 Personal history of nicotine dependence: Secondary | ICD-10-CM | POA: Diagnosis not present

## 2013-12-17 DIAGNOSIS — Z79899 Other long term (current) drug therapy: Secondary | ICD-10-CM | POA: Insufficient documentation

## 2013-12-17 DIAGNOSIS — H409 Unspecified glaucoma: Secondary | ICD-10-CM | POA: Insufficient documentation

## 2013-12-17 DIAGNOSIS — R109 Unspecified abdominal pain: Secondary | ICD-10-CM | POA: Insufficient documentation

## 2013-12-17 LAB — BASIC METABOLIC PANEL
ANION GAP: 12 (ref 5–15)
BUN: 10 mg/dL (ref 6–23)
CALCIUM: 9.4 mg/dL (ref 8.4–10.5)
CO2: 29 mEq/L (ref 19–32)
CREATININE: 0.99 mg/dL (ref 0.50–1.35)
Chloride: 97 mEq/L (ref 96–112)
GFR calc Af Amer: >90 mL/min (ref >90)
GFR calc non Af Amer: 80 mL/min — ABNORMAL LOW (ref >90)
GLUCOSE: 109 mg/dL — AB (ref 70–99)
Potassium: 3.9 mEq/L (ref 3.7–5.3)
Sodium: 138 mEq/L (ref 137–147)

## 2013-12-17 LAB — CBC WITH DIFFERENTIAL/PLATELET
BASOS PCT: 0 % (ref 0–1)
Basophils Absolute: 0 10*3/uL (ref 0.0–0.1)
EOS ABS: 0.1 10*3/uL (ref 0.0–0.7)
EOS PCT: 1 % (ref 0–5)
HEMATOCRIT: 27.7 % — AB (ref 39.0–52.0)
Hemoglobin: 8.7 g/dL — ABNORMAL LOW (ref 13.0–17.0)
Lymphocytes Relative: 21 % (ref 12–46)
Lymphs Abs: 1.5 10*3/uL (ref 0.7–4.0)
MCH: 23.6 pg — ABNORMAL LOW (ref 26.0–34.0)
MCHC: 31.4 g/dL (ref 30.0–36.0)
MCV: 75.3 fL — AB (ref 78.0–100.0)
Monocytes Absolute: 0.6 10*3/uL (ref 0.1–1.0)
Monocytes Relative: 8 % (ref 3–12)
Neutro Abs: 4.9 10*3/uL (ref 1.7–7.7)
Neutrophils Relative %: 70 % (ref 43–77)
Platelets: 402 10*3/uL — ABNORMAL HIGH (ref 150–400)
RBC: 3.68 MIL/uL — ABNORMAL LOW (ref 4.22–5.81)
RDW: 18.6 % — AB (ref 11.5–15.5)
WBC: 7.1 10*3/uL (ref 4.0–10.5)

## 2013-12-17 LAB — TROPONIN I: Troponin I: <0.3 ng/mL (ref <0.30)

## 2013-12-17 MED ORDER — IPRATROPIUM-ALBUTEROL 0.5-2.5 (3) MG/3ML IN SOLN
6.0000 mL | Freq: Once | RESPIRATORY_TRACT | Status: AC
  Administered 2013-12-17: 6 mL via RESPIRATORY_TRACT
  Filled 2013-12-17: qty 6

## 2013-12-17 MED ORDER — MORPHINE SULFATE 4 MG/ML IJ SOLN
6.0000 mg | Freq: Once | INTRAMUSCULAR | Status: DC
  Filled 2013-12-17: qty 2

## 2013-12-17 NOTE — ED Notes (Signed)
Made Dr. Reather Converse aware that he can contact radiology for chest xray results if unable to read in canopy.

## 2013-12-17 NOTE — Progress Notes (Addendum)
ED RN visit- [facility] ED rm 9-[facility])-K. Robertson RN  Pt to ED via EMS with increased shortness of breath and right flank pain. Pt seen at bedside, wife Delcie Roch and cousin Izell Stephenson present during visit.  Pt had just recvd a neb treatment, denied sob or pain, alert, oriented and interactive with Probation officer. Pt's wife Delcie Roch voiced her concern regarding pt's treatment at [facility]. She voiced her strong feeling that Seng may not be out of treatment options for his cancer and would like to get her husband into the [facility] for a second opinion.  Writer assured both she and Kenyatta that they were free to seek another opinion and that if  there was treatment available then that option could be explored. Writer discussed pt/family wishes with ED MD Dr. Reather Converse who agreed to make an outpatient referral to the [facility] or oncology consult if patient is admitted.  Both pt and his wife voiced understanding that the pursuit of treatment was not part of the hospice plan of care and that the hospice benefit would be revoked. Support offered, pt resting comfortably at end of visit. Current medication list given to pharmacy tech for patient's chart. [facility] will continue to follow.  Flo Shanks RN, BSN, [facility] Liaison [phone]

## 2013-12-17 NOTE — ED Notes (Signed)
Pt. On 2L O2 at home. Sats 98%

## 2013-12-17 NOTE — ED Notes (Signed)
Per PTAR: pt. Complaint of right sided flank pain this AM and SOB. Hx of stage IV lung cancer. Pt. Stable at 94% on RA. Pt. Of hospice x 2weeks. No DNR. Pt. Took pain meds before PTAR arrived. Pt. Takes oxycodone and morphine. PTAR states pain seems to be improving.

## 2013-12-17 NOTE — ED Notes (Signed)
Bed: WA09 Expected date:  Expected time:  Means of arrival:  Comments: EMS shob/lung ca pt.

## 2013-12-17 NOTE — ED Notes (Signed)
Notified respiratory of DUONEB order.

## 2013-12-17 NOTE — ED Provider Notes (Signed)
CSN: [id number]     Arrival date & time 12/17/13  0901 History   First MD Initiated Contact with Patient 12/17/13 [phone]     Chief Complaint  Patient presents with  . Shortness of Breath  . Flank Pain     (Consider location/radiation/quality/duration/timing/severity/associated sxs/prior Treatment) HPI Comments: 73 year old male with history of coronary artery disease high blood pressure, lipids, stage IV lung cancer followed at the [place], former smoker presents with worsening shortness breath since yesterday evening. Patient has had similar presentation with his lung cancer in the past. Patient denies blood clot history, leg swelling leg pain, recent surgery. Patient has mild right lower flank pain which is the same pain is had with his cancer. Patient on home oxygen 2 L nasal cannula as needed. Patient denies anterior chest pressure chest pain. Per report patient recently started on hospice, family none the room to clarify details.  Patient is a 73 y.o. male presenting with shortness of breath and flank pain. The history is provided by the patient.  Shortness of Breath Associated symptoms: cough   Associated symptoms: no abdominal pain, no chest pain, no fever, no headaches, no neck pain, no rash and no vomiting   Flank Pain Associated symptoms include shortness of breath. Pertinent negatives include no chest pain, no abdominal pain and no headaches.    Past Medical History  Diagnosis Date  . Bronchitis   . Glaucoma   . Heart disease   . Hyperlipidemia   . Hypertension   . CAD (coronary artery disease)     Remote cath ?1998.   . Tobacco abuse   . Cancer     lung and liver   Past Surgical History  Procedure Laterality Date  . None    . Cardiac catheterization     Family History  Problem Relation Age of Onset  . Hyperlipidemia Mother   . Heart disease Mother   . Stroke Mother   . Hypertension Mother   . Kidney disease Mother   . Diabetes Mother   . Diabetes Father   .  Hypertension Maternal Grandfather   . Hyperlipidemia Paternal Grandmother   . Diabetes Paternal Grandmother   . Heart attack Maternal Grandfather    History  Substance Use Topics  . Smoking status: Former Smoker -- 0.30 packs/day for 54 years  . Smokeless tobacco: Not on file     Comment: smokes a lot less than he used to  . Alcohol Use: No    Review of Systems  Constitutional: Positive for appetite change. Negative for fever and chills.  HENT: Negative for congestion.   Eyes: Negative for visual disturbance.  Respiratory: Positive for cough and shortness of breath.   Cardiovascular: Negative for chest pain.  Gastrointestinal: Negative for vomiting and abdominal pain.  Genitourinary: Positive for flank pain. Negative for dysuria.  Musculoskeletal: Negative for back pain, neck pain and neck stiffness.  Skin: Negative for rash.  Neurological: Negative for light-headedness and headaches.      Allergies  Review of patient's allergies indicates no known allergies.  Home Medications   Prior to Admission medications   Medication Sig Start Date End Date Taking? Authorizing Provider  albuterol (PROVENTIL HFA;VENTOLIN HFA) 108 (90 BASE) MCG/ACT inhaler Inhale 2 puffs into the lungs every 6 (six) hours as needed for wheezing or shortness of breath. 08/19/13   Domenic Polite, MD  amLODipine (NORVASC) 10 MG tablet Take 10 mg by mouth daily.    Historical Provider, MD  aspirin EC 81 MG  tablet Take 81 mg by mouth daily.    Historical Provider, MD  beclomethasone (QVAR) 80 MCG/ACT inhaler Inhale 1 puff into the lungs 2 (two) times daily. 05/10/12   Midge Minium, MD  carvedilol (COREG) 6.25 MG tablet Take 6.25 mg by mouth 2 (two) times daily with a meal.    Historical Provider, MD  dexamethasone (DECADRON) 4 MG tablet Take 4 mg by mouth 2 (two) times daily. Starting the day before chemo through the day after chemo for 3 days.    Historical Provider, MD  dextromethorphan-guaiFENesin  (MUCINEX DM) 30-600 MG per 12 hr tablet Take 1 tablet by mouth 2 (two) times daily as needed for cough.    Historical Provider, MD  docusate sodium (COLACE) 100 MG capsule Take 200 mg by mouth 2 (two) times daily.    Historical Provider, MD  folic acid (FOLVITE) 1 MG tablet Take 1 mg by mouth daily.    Historical Provider, MD  mirtazapine (REMERON) 15 MG tablet Take 15 mg by mouth at bedtime.    Historical Provider, MD  Multiple Vitamin (MULTIVITAMIN WITH MINERALS) TABS tablet Take 1 tablet by mouth daily. Inner Man Gold vitamin supplement    Historical Provider, MD  ondansetron (ZOFRAN) 8 MG tablet Take 8 mg by mouth every 8 (eight) hours as needed for nausea or vomiting.    Historical Provider, MD  oxyCODONE (OXY IR/ROXICODONE) 5 MG immediate release tablet Take 5-15 mg by mouth every 4 (four) hours as needed for severe pain.     Historical Provider, MD  senna (SENOKOT) 8.6 MG TABS tablet Take 1 tablet by mouth 2 (two) times daily.     Historical Provider, MD  vitamin C (ASCORBIC ACID) 500 MG tablet Take 500 mg by mouth daily.    Historical Provider, MD   Pulse 72  Temp(Src) 98.4 F (36.9 C) (Oral)  Resp 24  SpO2 88% Physical Exam  Nursing note and vitals reviewed. Constitutional: He is oriented to person, place, and time. He appears well-developed and well-nourished. No distress.  HENT:  Head: Normocephalic and atraumatic.  Eyes: Conjunctivae are normal. Right eye exhibits no discharge. Left eye exhibits no discharge.  Neck: Normal range of motion. Neck supple. No tracheal deviation present.  Cardiovascular: Normal rate and regular rhythm.   Pulmonary/Chest: No respiratory distress (mild tachypnea). He has rales (right lower and right mid lung fields).  Abdominal: Soft. He exhibits no distension. There is no tenderness. There is no guarding.  Musculoskeletal: He exhibits tenderness (right mid flank rib area). He exhibits no edema.  Neurological: He is alert and oriented to person, place,  and time.  Skin: Skin is warm. No rash noted.  Psychiatric: He has a normal mood and affect.    ED Course  Procedures (including critical care time) Labs Review Labs Reviewed  BASIC METABOLIC PANEL - Abnormal; Notable for the following:    Glucose, Bld 109 (*)    GFR calc non Af Amer 80 (*)    All other components within normal limits  CBC WITH DIFFERENTIAL - Abnormal; Notable for the following:    RBC 3.68 (*)    Hemoglobin 8.7 (*)    HCT 27.7 (*)    MCV 75.3 (*)    MCH 23.6 (*)    RDW 18.6 (*)    Platelets 402 (*)    All other components within normal limits  TROPONIN I    Imaging Review No results found.   EKG Interpretation   Date/Time:  Wednesday  December 17 2013 09:06:26 EDT Ventricular Rate:  66 PR Interval:  189 QRS Duration: 112 QT Interval:  423 QTC Calculation: 443 R Axis:   -9 Text Interpretation:  Sinus rhythm Borderline intraventricular conduction  delay Baseline wander in lead(s) II III aVR aVL aVF V6 Confirmed by Aigner Horseman   MD, Brittan Butterbaugh ([id number]) on 12/17/2013 12:53:26 PM      MDM   Final diagnoses:  Shortness of breath  Lung cancer, primary, with metastasis from lung to other site, right  Anemia, unspecified   Patient has stage IV lung cancer presents with shortness of breath and right flank pain similar to previous. Clinically feel most likely related to lung cancer/possible pleural effusion. Discussed pulmonary embolism also in the differential however patient feels this is the same symptoms he said multiple times, patient recent starting hospice. Plan for cardiac screen and chest x-ray.  Patient observed in the ER and comfortable on recheck requiring on 1 L nasal cannula. Chest x-ray reviewed with radiology showing bilateral metastases, mild increased diffusion in lytic lesion right ribs. Patient to followup with oncology and hospice. Hospice nurse in the ER to assist with followup.  Results and differential diagnosis were discussed with the  patient/parent/guardian. Close follow up outpatient was discussed, comfortable with the plan.   Medications  ipratropium-albuterol (DUONEB) 0.5-2.5 (3) MG/3ML nebulizer solution 6 mL (6 mLs Nebulization Given 12/17/13 1042)    Filed Vitals:   12/17/13 1100 12/17/13 1130 12/17/13 1200 12/17/13 1230  BP: 128/58 129/57 126/57 112/87  Pulse: 56 58 56 55  Temp:      TempSrc:      Resp: 18 17 24 12   SpO2: 99% 99% 99% 99%         Mariea Clonts, MD 12/17/13 1303

## 2013-12-17 NOTE — ED Notes (Signed)
Made radiology aware that results show in process. Radiology will get in touch with technologist who did xray.

## 2013-12-17 NOTE — Discharge Instructions (Signed)
Followup closely with hospice and oncology.  If you were given medicines take as directed.  If you are on coumadin or contraceptives realize their levels and effectiveness is altered by many different medicines.  If you have any reaction (rash, tongues swelling, other) to the medicines stop taking and see a physician.   Please follow up as directed and return to the ER or see a physician for new or worsening symptoms.  Thank you. Filed Vitals:   12/17/13 1100 12/17/13 1130 12/17/13 1200 12/17/13 1230  BP: 128/58 129/57 126/57 112/87  Pulse: 56 58 56 55  Temp:      TempSrc:      Resp: 18 17 24 12   SpO2: 99% 99% 99% 99%

## 2013-12-22 ENCOUNTER — Telehealth: Payer: Self-pay | Admitting: Internal Medicine

## 2013-12-22 NOTE — Telephone Encounter (Signed)
S/W PATIENT WIFE AND GAVE NP APPT FOR 09/16 @ 1:30 W/DR. MOHAMED.  REFERRING [facility] DX- LUNG CA

## 2013-12-22 NOTE — Telephone Encounter (Signed)
C/D 12/22/13 for appt. 12/24/13

## 2013-12-23 ENCOUNTER — Other Ambulatory Visit: Payer: Self-pay

## 2013-12-23 DIAGNOSIS — E43 Unspecified severe protein-calorie malnutrition: Secondary | ICD-10-CM

## 2013-12-23 DIAGNOSIS — IMO0001 Reserved for inherently not codable concepts without codable children: Secondary | ICD-10-CM

## 2013-12-23 DIAGNOSIS — J209 Acute bronchitis, unspecified: Secondary | ICD-10-CM

## 2013-12-24 ENCOUNTER — Ambulatory Visit: Payer: Medicare Other

## 2013-12-24 ENCOUNTER — Other Ambulatory Visit (HOSPITAL_BASED_OUTPATIENT_CLINIC_OR_DEPARTMENT_OTHER)

## 2013-12-24 ENCOUNTER — Encounter: Payer: Self-pay | Admitting: Internal Medicine

## 2013-12-24 ENCOUNTER — Ambulatory Visit (HOSPITAL_BASED_OUTPATIENT_CLINIC_OR_DEPARTMENT_OTHER): Admitting: Internal Medicine

## 2013-12-24 ENCOUNTER — Telehealth: Payer: Self-pay | Admitting: Internal Medicine

## 2013-12-24 ENCOUNTER — Ambulatory Visit: Payer: Medicare Other | Admitting: Internal Medicine

## 2013-12-24 ENCOUNTER — Other Ambulatory Visit: Payer: Medicare Other

## 2013-12-24 VITALS — BP 131/57 | HR 59 | Temp 98.0°F | Resp 19 | Ht 70.0 in | Wt 145.9 lb

## 2013-12-24 DIAGNOSIS — C3491 Malignant neoplasm of unspecified part of right bronchus or lung: Secondary | ICD-10-CM

## 2013-12-24 DIAGNOSIS — R059 Cough, unspecified: Secondary | ICD-10-CM

## 2013-12-24 DIAGNOSIS — R0602 Shortness of breath: Secondary | ICD-10-CM

## 2013-12-24 DIAGNOSIS — C7951 Secondary malignant neoplasm of bone: Secondary | ICD-10-CM

## 2013-12-24 DIAGNOSIS — R05 Cough: Secondary | ICD-10-CM

## 2013-12-24 DIAGNOSIS — C3430 Malignant neoplasm of lower lobe, unspecified bronchus or lung: Secondary | ICD-10-CM

## 2013-12-24 DIAGNOSIS — E43 Unspecified severe protein-calorie malnutrition: Secondary | ICD-10-CM

## 2013-12-24 DIAGNOSIS — J209 Acute bronchitis, unspecified: Secondary | ICD-10-CM

## 2013-12-24 DIAGNOSIS — C3490 Malignant neoplasm of unspecified part of unspecified bronchus or lung: Secondary | ICD-10-CM | POA: Insufficient documentation

## 2013-12-24 DIAGNOSIS — R0609 Other forms of dyspnea: Secondary | ICD-10-CM

## 2013-12-24 DIAGNOSIS — C7952 Secondary malignant neoplasm of bone marrow: Secondary | ICD-10-CM

## 2013-12-24 DIAGNOSIS — R0989 Other specified symptoms and signs involving the circulatory and respiratory systems: Secondary | ICD-10-CM

## 2013-12-24 DIAGNOSIS — IMO0001 Reserved for inherently not codable concepts without codable children: Secondary | ICD-10-CM

## 2013-12-24 DIAGNOSIS — K7689 Other specified diseases of liver: Secondary | ICD-10-CM

## 2013-12-24 LAB — CBC WITH DIFFERENTIAL/PLATELET
BASO%: 1 % (ref 0.0–2.0)
Basophils Absolute: 0.1 10*3/uL (ref 0.0–0.1)
EOS%: 2.3 % (ref 0.0–7.0)
Eosinophils Absolute: 0.1 10*3/uL (ref 0.0–0.5)
HCT: 23 % — ABNORMAL LOW (ref 38.4–49.9)
HGB: 7.2 g/dL — ABNORMAL LOW (ref 13.0–17.1)
LYMPH%: 20.4 % (ref 14.0–49.0)
MCH: 23.2 pg — AB (ref 27.2–33.4)
MCHC: 31.2 g/dL — ABNORMAL LOW (ref 32.0–36.0)
MCV: 74.5 fL — AB (ref 79.3–98.0)
MONO#: 0.9 10*3/uL (ref 0.1–0.9)
MONO%: 14.5 % — AB (ref 0.0–14.0)
NEUT#: 3.7 10*3/uL (ref 1.5–6.5)
NEUT%: 61.8 % (ref 39.0–75.0)
Platelets: 369 10*3/uL (ref 140–400)
RBC: 3.09 10*6/uL — ABNORMAL LOW (ref 4.20–5.82)
RDW: 20.9 % — AB (ref 11.0–14.6)
WBC: 6.1 10*3/uL (ref 4.0–10.3)
lymph#: 1.2 10*3/uL (ref 0.9–3.3)

## 2013-12-24 LAB — COMPREHENSIVE METABOLIC PANEL (CC13)
ALK PHOS: 63 U/L (ref 40–150)
ALT: 9 U/L (ref 0–55)
AST: 24 U/L (ref 5–34)
Albumin: 2.2 g/dL — ABNORMAL LOW (ref 3.5–5.0)
Anion Gap: 9 mEq/L (ref 3–11)
BUN: 17.3 mg/dL (ref 7.0–26.0)
CO2: 29 mEq/L (ref 22–29)
Calcium: 9.1 mg/dL (ref 8.4–10.4)
Chloride: 104 mEq/L (ref 98–109)
Creatinine: 1.2 mg/dL (ref 0.7–1.3)
Glucose: 94 mg/dl (ref 70–140)
Potassium: 4 mEq/L (ref 3.5–5.1)
SODIUM: 143 meq/L (ref 136–145)
Total Bilirubin: 0.2 mg/dL (ref 0.20–1.20)
Total Protein: 8.9 g/dL — ABNORMAL HIGH (ref 6.4–8.3)

## 2013-12-24 NOTE — Progress Notes (Signed)
[place] [phone]   [phone]  CONSULT NOTE  REFERRING PHYSICIAN: Dr. Cranston Neighbor  REASON FOR CONSULTATION:  73 years old African American male with metastatic lung cancer.  HPI [name] is a 73 y.o. male was past medical history significant for hypertension, anemia of chronic disease, renal insufficiency, dyslipidemia and coronary artery disease. The patient mentions that in July of 2014 he start complaining of cough productive of bloody sputum. He was seen at the [facility] in [place], [place]. Chest x-ray showed findings concerning for bronchogenic carcinoma. This was followed by CT scan of the chest, abdomen and pelvis at that time. It showed a mass within the superior segment of the right lower lobe measuring 3.4 x 3.2 x 3.6 CM with multiple additional right sided pulmonary nodules. There was a right pleural mass and hilar adenopathy. These findings were concerning for metastatic lung cancer. A PET scan was performed on 10/23/2012 and it showed the right lower lobe pulmonary mass with multiple satellite lesions throughout the right lung and right pleural space as well as right hilar hypermetabolic lymphadenopathy concerning for primary lung malignancy with metastatic disease. On 11/04/2012 the patient underwent CT-guided right paravertebral pleural mass biopsy by interventional radiology at the [facility]. The final pathology Case # [id number] showed malignant cells consistent with adenocarcinoma. The malignant cells were immunoreactive with CK 7 and TTF-1 and nonreactive with CK 20 and CDX-2. The morphology and immunophenotype were consistent with adenocarcinoma of lung primary. The tissue blocks were sent for molecular testing and has negative EGFR mutation. MRI of the brain was performed on 11/15/2012 and was negative for malignancy. The patient was initially started on treatment with systemic chemotherapy with carboplatin for AUC of 5  and paclitaxel 200 mg/M2 for 2 cycles. Avastin 15 mg/kg was added to this regimen starting from cycle #3 and cycle #4. Restaging scan on 01/24/2013 showed response to the treatment with interval decrease in the dominant right infrahilar pulmonary mass as well as decreasing size of multiple pleural and parenchymal pulmonary nodules. There was no evidence for metastatic disease within the abdomen or pelvis. His last dose number 4 of the induction chemotherapy was given on 02/05/2013 followed by maintenance Avastin 15 mg/KG every 3 weeks. On 03/16/2013 Avastin treatment was placed on hold secondary to proteinuria followed by dental abscess. Restaging workup on 07/09/2013 showed disease progression in the right hemothorax, pleural space and mediastinum in addition to minimal increase in the size of the subcentimeter liver lesions raising possibility of liver metastasis. There was also increased metastatic involvement of the ribs.  The patient was started on second line chemotherapy with single agent Alimta at 500 mg/M2 between 07/23/2013 until 10/08/2013 for a total of 4 cycles but this was discontinued after restaging scan on 10/24/2013 showed evidence for disease progression with increase in the size of the right hilar mass, multiple right pulmonary masses, right hilar adenopathy and a right pleural nodularity. There was a stable hepatic lesions and worsening osseous metastatic disease in the right ninth posterior rib. The patient was started on treatment with Nivolumab 3 MG/KG on 11/05/2013 status post 1 cycle but because of decline in his general condition he was referred to hospice on 11/19/2013. MRI of the brain at that time showed no evidence for metastatic disease to the brain. The patient and his wife were not satisfied with the hospice referral and he came today for evaluation and discussion of his treatment options. When seen today  he continues to have pain on the right side of the chest as well as  shortness of breath at baseline and increased with exertion and mild cough. He also lost around 30 pounds over the last 14 months. He denied having any nausea or vomiting, no fever or chills. The patient denied having any headache or visual changes. Family history significant for a mother with hypertension and diabetes mellitus and father died in an accident. The patient is married and has 1 daughter. He was accompanied today by his wife Noami. He is currently retired and used to work in [employer]. He has a history of smoking one pack per day for around 50 years and quit in July of 2014. He has no history of alcohol or drug abuse.   HPI  Past Medical History  Diagnosis Date  . Bronchitis   . Glaucoma   . Heart disease   . Hyperlipidemia   . Hypertension   . CAD (coronary artery disease)     Remote cath ?1998.   . Tobacco abuse   . Cancer     lung and liver    Past Surgical History  Procedure Laterality Date  . None    . Cardiac catheterization      Family History  Problem Relation Age of Onset  . Hyperlipidemia Mother   . Heart disease Mother   . Stroke Mother   . Hypertension Mother   . Kidney disease Mother   . Diabetes Mother   . Diabetes Father   . Hypertension Maternal Grandfather   . Hyperlipidemia Paternal Grandmother   . Diabetes Paternal Grandmother   . Heart attack Maternal Grandfather     Social History History  Substance Use Topics  . Smoking status: Former Smoker -- 0.30 packs/day for 54 years  . Smokeless tobacco: Not on file     Comment: smokes a lot less than he used to  . Alcohol Use: No    No Known Allergies  Current Outpatient Prescriptions  Medication Sig Dispense Refill  . albuterol (PROVENTIL HFA;VENTOLIN HFA) 108 (90 BASE) MCG/ACT inhaler Inhale 2 puffs into the lungs every 6 (six) hours as needed for wheezing or shortness of breath.  1 Inhaler  0  . amLODipine (NORVASC) 10 MG tablet Take 10 mg by mouth daily with breakfast.       .  carvedilol (COREG) 6.25 MG tablet Take 6.25 mg by mouth 2 (two) times daily with a meal.      . docusate sodium (COLACE) 100 MG capsule Take 100 mg by mouth daily.       . furosemide (LASIX) 20 MG tablet Take 20 mg by mouth daily after breakfast.       . hydrochlorothiazide (HYDRODIURIL) 25 MG tablet Take 25 mg by mouth daily with breakfast.       . morphine (MSIR) 30 MG tablet Take 30 mg by mouth 2 (two) times daily.      . ondansetron (ZOFRAN) 8 MG tablet Take 8 mg by mouth 2 (two) times daily as needed for nausea or vomiting.       Marland Kitchen oxyCODONE (ROXICODONE) 15 MG immediate release tablet Take 15 mg by mouth every 4 (four) hours as needed for pain.      . polyethylene glycol (MIRALAX / GLYCOLAX) packet Take 17 g by mouth daily as needed for moderate constipation.       . senna (SENOKOT) 8.6 MG TABS tablet Take 1 tablet by mouth 2 (two) times daily.       Marland Kitchen  sertraline (ZOLOFT) 25 MG tablet Take 25 mg by mouth daily with breakfast.       . dexamethasone (DECADRON) 4 MG tablet Take 4 mg by mouth 2 (two) times daily. Starting the day before chemo through the day after chemo for 3 days.       No current facility-administered medications for this visit.    Review of Systems  Constitutional: positive for fatigue Eyes: negative Ears, nose, mouth, throat, and face: negative Respiratory: positive for dyspnea on exertion Cardiovascular: negative Gastrointestinal: negative Genitourinary:negative Integument/breast: negative Hematologic/lymphatic: negative Musculoskeletal:negative Neurological: negative Behavioral/Psych: negative Endocrine: negative Allergic/Immunologic: negative  Physical Exam  TKP:TWSFK, healthy, no distress, well nourished and well developed SKIN: skin color, texture, turgor are normal, no rashes or significant lesions HEAD: Normocephalic, No masses, lesions, tenderness or abnormalities EYES: normal, PERRLA EARS: External ears normal, Canals clear OROPHARYNX:no exudate,  no erythema and lips, buccal mucosa, and tongue normal  NECK: supple, no adenopathy, no JVD LYMPH:  no palpable lymphadenopathy, no hepatosplenomegaly LUNGS: expiratory wheezes bilaterally HEART: regular rate & rhythm and no murmurs ABDOMEN:abdomen soft, non-tender, normal bowel sounds and no masses or organomegaly BACK: Back symmetric, no curvature., No CVA tenderness EXTREMITIES:no joint deformities, effusion, or inflammation, no edema, no skin discoloration  NEURO: alert & oriented x 3 with fluent speech, no focal motor/sensory deficits  PERFORMANCE STATUS: ECOG 1-2  LABORATORY DATA: Lab Results  Component Value Date   WBC 6.1 12/24/2013   HGB 7.2* 12/24/2013   HCT 23.0* 12/24/2013   MCV 74.5* 12/24/2013   PLT 369 12/24/2013      Chemistry      Component Value Date/Time   NA 143 12/24/2013 1303   NA 138 12/17/2013 0956   K 4.0 12/24/2013 1303   K 3.9 12/17/2013 0956   CL 97 12/17/2013 0956   CO2 29 12/24/2013 1303   CO2 29 12/17/2013 0956   BUN 17.3 12/24/2013 1303   BUN 10 12/17/2013 0956   CREATININE 1.2 12/24/2013 1303   CREATININE 0.99 12/17/2013 0956      Component Value Date/Time   CALCIUM 9.1 12/24/2013 1303   CALCIUM 9.4 12/17/2013 0956   ALKPHOS 63 12/24/2013 1303   ALKPHOS 100 05/16/2013 2228   AST 24 12/24/2013 1303   AST 20 05/16/2013 2228   ALT 9 12/24/2013 1303   ALT 12 05/16/2013 2228   BILITOT 0.20 12/24/2013 1303   BILITOT 0.3 05/16/2013 2228       RADIOGRAPHIC STUDIES: Dg Chest 2 View  12/17/2013   CLINICAL DATA:  Lung cancer, shortness of breath, flank pain, history of smoking, hypertension, coronary artery disease  EXAM: CHEST  2 VIEW  COMPARISON:  08/17/2013; correlation CT chest 08/18/2013  FINDINGS: Normal heart size and pulmonary vascularity.  Enlarged RIGHT hilum likely combination of hilar adenopathy and known perihilar mass.  Additional nodular foci more peripherally in the RIGHT middle and RIGHT lower lobes compatible with pulmonary metastases.  Mild RIGHT basilar  atelectasis and small effusion.  LEFT lung grossly clear.  Probable 15 mm LEFT mid lung nodule as well.  No definite infiltrate or pneumothorax.  Progressive destruction of the lateral RIGHT third rib consistent with metastasis.  No new osseous findings.  IMPRESSION: Enlarged RIGHT hilum likely combination of hilar adenopathy and known perihilar mass.  BILATERAL pulmonary metastases.  Increased RIGHT basilar pleural effusion and atelectasis.  Destructive lytic lesion involving the RIGHT third rib.   Electronically Signed   By: Lavonia Dana M.D.   On: 12/17/2013 10:48  ASSESSMENT: This is a very pleasant 73 years old African American male with metastatic non-small cell lung cancer, adenocarcinoma with negative EGFR mutation status post systemic chemotherapy with induction treatment in the form of carboplatin, paclitaxel and few dose of maintenance Avastin, followed by second line chemotherapy with single agent Alimta discontinued secondary to disease progression. The patient was also tried on treatment with immunotherapy for 1 cycle but this was discontinued secondary to fatigue.   PLAN: I I spent a long time reviewing his previous records from the [facility] hospital and I had a lengthy discussion with the patient and his wife today about his current disease stage, prognosis and treatment options. The patient still has a reasonable performance status and he would like to seek other option for treatment of his condition and not willing to give up at this point. I recommended for the patient to have repeat CT scan of the chest, abdomen and pelvis for restaging of his disease. The patient will be a good candidate for palliative radiotherapy to the painful lesion on the right side of the chest. He may also be a good candidate for resuming immunotherapy with Nivolumab or consideration of other chemotherapy including docetaxel and Cyramza. I will arrange for the patient a followup appointment with me in 2 weeks for  reevaluation and more detailed discussion of his treatment options based on the restaging scans. He was advised to call immediately if he has any concerning symptoms in the interval.  The patient voices understanding of current disease status and treatment options and is in agreement with the current care plan.  All questions were answered. The patient knows to call the clinic with any problems, questions or concerns. We can certainly see the patient much sooner if necessary.  Thank you so much for allowing me to participate in the care of [place]. I will continue to follow up the patient with you and assist in his care.  I spent 55 minutes counseling the patient face to face. The total time spent in the appointment was 80 minutes.  Disclaimer: This note was dictated with voice recognition software. Similar sounding words can inadvertently be transcribed and may not be corrected upon review.   Toretto Tingler K. 12/24/2013, 3:16 PM

## 2013-12-24 NOTE — Telephone Encounter (Signed)
gv adn printed appt sched and avs for pt fro OCT...gv pt barium

## 2013-12-24 NOTE — Progress Notes (Signed)
Checked in new patient with no issues prior to seeing the dr. He has appt card  °

## 2013-12-26 NOTE — Progress Notes (Signed)
Quick Note:  Call patient with the result and arrange for PRBCs transfusion ______

## 2013-12-30 ENCOUNTER — Other Ambulatory Visit: Payer: Self-pay | Admitting: *Deleted

## 2013-12-30 ENCOUNTER — Ambulatory Visit (HOSPITAL_COMMUNITY)
Admission: RE | Admit: 2013-12-30 | Discharge: 2013-12-30 | Disposition: A | Discharge status: Home / Self Care | Source: Ambulatory Visit | Attending: Internal Medicine | Admitting: Internal Medicine

## 2013-12-30 ENCOUNTER — Telehealth: Payer: Self-pay | Admitting: *Deleted

## 2013-12-30 DIAGNOSIS — D649 Anemia, unspecified: Secondary | ICD-10-CM | POA: Insufficient documentation

## 2013-12-30 NOTE — Telephone Encounter (Signed)
Hbg 7.2.  Called and spoke with patient.  He has been feeling more SOB and fatigued.  Has not had a blood transfusion before.  Denies any bleeding issues.  Contacted scheduling to see when there is availability and will call him back.

## 2013-12-30 NOTE — Telephone Encounter (Signed)
Per staff message I have scheduled appts. Advised Automotive engineer. JMW

## 2013-12-30 NOTE — Telephone Encounter (Signed)
Pt's wife is aware of appts on 9/25.

## 2013-12-30 NOTE — Telephone Encounter (Signed)
error 

## 2013-12-30 NOTE — Telephone Encounter (Signed)
Message copied by Sharday Michl, [place] on Tue Dec 30, 2013 10:54 AM ------      Message from: Curt Bears      Created: Fri Dec 26, 2013  8:33 PM       Call patient with the result and arrange for PRBCs transfusion ------

## 2013-12-31 ENCOUNTER — Encounter (HOSPITAL_COMMUNITY): Payer: Self-pay

## 2013-12-31 ENCOUNTER — Other Ambulatory Visit: Payer: Self-pay | Admitting: *Deleted

## 2013-12-31 ENCOUNTER — Ambulatory Visit (HOSPITAL_COMMUNITY)
Admission: RE | Admit: 2013-12-31 | Discharge: 2013-12-31 | Disposition: A | Discharge status: Home / Self Care | Payer: Medicare Other | Source: Ambulatory Visit | Attending: Internal Medicine | Admitting: Internal Medicine

## 2013-12-31 DIAGNOSIS — N400 Enlarged prostate without lower urinary tract symptoms: Secondary | ICD-10-CM | POA: Diagnosis not present

## 2013-12-31 DIAGNOSIS — R0602 Shortness of breath: Secondary | ICD-10-CM | POA: Insufficient documentation

## 2013-12-31 DIAGNOSIS — C3490 Malignant neoplasm of unspecified part of unspecified bronchus or lung: Secondary | ICD-10-CM | POA: Insufficient documentation

## 2013-12-31 DIAGNOSIS — K5900 Constipation, unspecified: Secondary | ICD-10-CM | POA: Insufficient documentation

## 2013-12-31 DIAGNOSIS — Z9221 Personal history of antineoplastic chemotherapy: Secondary | ICD-10-CM | POA: Diagnosis not present

## 2013-12-31 DIAGNOSIS — C3491 Malignant neoplasm of unspecified part of right bronchus or lung: Secondary | ICD-10-CM

## 2013-12-31 DIAGNOSIS — C7951 Secondary malignant neoplasm of bone: Secondary | ICD-10-CM | POA: Insufficient documentation

## 2013-12-31 DIAGNOSIS — C7952 Secondary malignant neoplasm of bone marrow: Secondary | ICD-10-CM

## 2013-12-31 MED ORDER — IOHEXOL 300 MG/ML  SOLN
100.0000 mL | Freq: Once | INTRAMUSCULAR | Status: AC | PRN
  Administered 2013-12-31: 100 mL via INTRAVENOUS

## 2014-01-02 ENCOUNTER — Other Ambulatory Visit (HOSPITAL_BASED_OUTPATIENT_CLINIC_OR_DEPARTMENT_OTHER)

## 2014-01-02 ENCOUNTER — Ambulatory Visit (HOSPITAL_BASED_OUTPATIENT_CLINIC_OR_DEPARTMENT_OTHER)

## 2014-01-02 VITALS — BP 132/50 | HR 57 | Temp 97.3°F | Resp 18

## 2014-01-02 DIAGNOSIS — R5383 Other fatigue: Secondary | ICD-10-CM

## 2014-01-02 DIAGNOSIS — C7952 Secondary malignant neoplasm of bone marrow: Secondary | ICD-10-CM

## 2014-01-02 DIAGNOSIS — C3430 Malignant neoplasm of lower lobe, unspecified bronchus or lung: Secondary | ICD-10-CM

## 2014-01-02 DIAGNOSIS — R5381 Other malaise: Secondary | ICD-10-CM

## 2014-01-02 DIAGNOSIS — D649 Anemia, unspecified: Secondary | ICD-10-CM | POA: Diagnosis not present

## 2014-01-02 DIAGNOSIS — R0602 Shortness of breath: Secondary | ICD-10-CM

## 2014-01-02 DIAGNOSIS — C3491 Malignant neoplasm of unspecified part of right bronchus or lung: Secondary | ICD-10-CM

## 2014-01-02 DIAGNOSIS — C7951 Secondary malignant neoplasm of bone: Secondary | ICD-10-CM

## 2014-01-02 LAB — CBC WITH DIFFERENTIAL/PLATELET
BASO%: 0.7 % (ref 0.0–2.0)
Basophils Absolute: 0 10*3/uL (ref 0.0–0.1)
EOS ABS: 0.1 10*3/uL (ref 0.0–0.5)
EOS%: 1.7 % (ref 0.0–7.0)
HCT: 23.6 % — ABNORMAL LOW (ref 38.4–49.9)
HGB: 7.3 g/dL — ABNORMAL LOW (ref 13.0–17.1)
LYMPH#: 1.1 10*3/uL (ref 0.9–3.3)
LYMPH%: 19.7 % (ref 14.0–49.0)
MCH: 22.1 pg — ABNORMAL LOW (ref 27.2–33.4)
MCHC: 31 g/dL — ABNORMAL LOW (ref 32.0–36.0)
MCV: 71.2 fL — ABNORMAL LOW (ref 79.3–98.0)
MONO#: 0.7 10*3/uL (ref 0.1–0.9)
MONO%: 12.6 % (ref 0.0–14.0)
NEUT%: 65.3 % (ref 39.0–75.0)
NEUTROS ABS: 3.8 10*3/uL (ref 1.5–6.5)
Platelets: 391 10*3/uL (ref 140–400)
RBC: 3.32 10*6/uL — AB (ref 4.20–5.82)
RDW: 20.9 % — AB (ref 11.0–14.6)
WBC: 5.8 10*3/uL (ref 4.0–10.3)

## 2014-01-02 LAB — COMPREHENSIVE METABOLIC PANEL (CC13)
ALBUMIN: 2.1 g/dL — AB (ref 3.5–5.0)
ALT: 6 U/L (ref 0–55)
ANION GAP: 8 meq/L (ref 3–11)
AST: 20 U/L (ref 5–34)
Alkaline Phosphatase: 64 U/L (ref 40–150)
BUN: 13.2 mg/dL (ref 7.0–26.0)
CALCIUM: 9.3 mg/dL (ref 8.4–10.4)
CHLORIDE: 99 meq/L (ref 98–109)
CO2: 34 meq/L — AB (ref 22–29)
Creatinine: 1.2 mg/dL (ref 0.7–1.3)
GLUCOSE: 120 mg/dL (ref 70–140)
POTASSIUM: 3.5 meq/L (ref 3.5–5.1)
SODIUM: 141 meq/L (ref 136–145)
Total Bilirubin: 0.32 mg/dL (ref 0.20–1.20)
Total Protein: 8.8 g/dL — ABNORMAL HIGH (ref 6.4–8.3)

## 2014-01-02 LAB — PREPARE RBC (CROSSMATCH)

## 2014-01-02 LAB — ABO/RH: ABO/RH(D): A POS

## 2014-01-02 MED ORDER — ACETAMINOPHEN 325 MG PO TABS
ORAL_TABLET | ORAL | Status: AC
  Filled 2014-01-02: qty 2

## 2014-01-02 MED ORDER — DIPHENHYDRAMINE HCL 25 MG PO CAPS
25.0000 mg | ORAL_CAPSULE | Freq: Once | ORAL | Status: AC
  Administered 2014-01-02: 25 mg via ORAL

## 2014-01-02 MED ORDER — SODIUM CHLORIDE 0.9 % IV SOLN: 250.0000 mL | Freq: Once | INTRAVENOUS | Status: DC

## 2014-01-02 MED ORDER — DIPHENHYDRAMINE HCL 25 MG PO CAPS
ORAL_CAPSULE | ORAL | Status: AC
  Filled 2014-01-02: qty 1

## 2014-01-02 MED ORDER — ACETAMINOPHEN 325 MG PO TABS
650.0000 mg | ORAL_TABLET | Freq: Once | ORAL | Status: AC
  Administered 2014-01-02: 650 mg via ORAL

## 2014-01-02 NOTE — Patient Instructions (Signed)

## 2014-01-03 LAB — TYPE AND SCREEN
ABO/RH(D): A POS
ANTIBODY SCREEN: NEGATIVE
UNIT DIVISION: 0
Unit division: 0

## 2014-01-06 ENCOUNTER — Inpatient Hospital Stay (HOSPITAL_COMMUNITY)
Admission: EM | Admit: 2014-01-06 | Discharge: 2014-01-09 | DRG: 054 | Disposition: A | Discharge status: Skilled Nursing Facility | Payer: Non-veteran care | Attending: Internal Medicine | Admitting: Internal Medicine

## 2014-01-06 ENCOUNTER — Encounter (HOSPITAL_COMMUNITY): Payer: Self-pay | Admitting: Emergency Medicine

## 2014-01-06 ENCOUNTER — Emergency Department (HOSPITAL_COMMUNITY): Payer: Non-veteran care

## 2014-01-06 DIAGNOSIS — Z833 Family history of diabetes mellitus: Secondary | ICD-10-CM | POA: Diagnosis not present

## 2014-01-06 DIAGNOSIS — Z9221 Personal history of antineoplastic chemotherapy: Secondary | ICD-10-CM | POA: Diagnosis not present

## 2014-01-06 DIAGNOSIS — E785 Hyperlipidemia, unspecified: Secondary | ICD-10-CM | POA: Diagnosis present

## 2014-01-06 DIAGNOSIS — C7949 Secondary malignant neoplasm of other parts of nervous system: Secondary | ICD-10-CM

## 2014-01-06 DIAGNOSIS — G936 Cerebral edema: Secondary | ICD-10-CM | POA: Diagnosis present

## 2014-01-06 DIAGNOSIS — H409 Unspecified glaucoma: Secondary | ICD-10-CM | POA: Diagnosis present

## 2014-01-06 DIAGNOSIS — C787 Secondary malignant neoplasm of liver and intrahepatic bile duct: Secondary | ICD-10-CM | POA: Diagnosis present

## 2014-01-06 DIAGNOSIS — C7931 Secondary malignant neoplasm of brain: Secondary | ICD-10-CM | POA: Diagnosis not present

## 2014-01-06 DIAGNOSIS — C3490 Malignant neoplasm of unspecified part of unspecified bronchus or lung: Secondary | ICD-10-CM | POA: Diagnosis present

## 2014-01-06 DIAGNOSIS — I10 Essential (primary) hypertension: Secondary | ICD-10-CM | POA: Diagnosis present

## 2014-01-06 DIAGNOSIS — I2510 Atherosclerotic heart disease of native coronary artery without angina pectoris: Secondary | ICD-10-CM | POA: Diagnosis present

## 2014-01-06 DIAGNOSIS — C3492 Malignant neoplasm of unspecified part of left bronchus or lung: Secondary | ICD-10-CM

## 2014-01-06 DIAGNOSIS — Z87891 Personal history of nicotine dependence: Secondary | ICD-10-CM | POA: Diagnosis not present

## 2014-01-06 DIAGNOSIS — E43 Unspecified severe protein-calorie malnutrition: Secondary | ICD-10-CM | POA: Diagnosis present

## 2014-01-06 DIAGNOSIS — Z79899 Other long term (current) drug therapy: Secondary | ICD-10-CM

## 2014-01-06 DIAGNOSIS — C3491 Malignant neoplasm of unspecified part of right bronchus or lung: Secondary | ICD-10-CM | POA: Diagnosis present

## 2014-01-06 DIAGNOSIS — Z8249 Family history of ischemic heart disease and other diseases of the circulatory system: Secondary | ICD-10-CM | POA: Diagnosis not present

## 2014-01-06 DIAGNOSIS — G8194 Hemiplegia, unspecified affecting left nondominant side: Secondary | ICD-10-CM | POA: Diagnosis present

## 2014-01-06 DIAGNOSIS — D649 Anemia, unspecified: Secondary | ICD-10-CM | POA: Diagnosis present

## 2014-01-06 DIAGNOSIS — Z79891 Long term (current) use of opiate analgesic: Secondary | ICD-10-CM

## 2014-01-06 DIAGNOSIS — Z823 Family history of stroke: Secondary | ICD-10-CM

## 2014-01-06 DIAGNOSIS — I611 Nontraumatic intracerebral hemorrhage in hemisphere, cortical: Secondary | ICD-10-CM | POA: Diagnosis present

## 2014-01-06 HISTORY — DX: Unspecified glaucoma: H40.9

## 2014-01-06 LAB — PROTIME-INR
INR: 1.21 (ref 0.00–1.49)
PROTHROMBIN TIME: 15.3 s — AB (ref 11.6–15.2)

## 2014-01-06 LAB — COMPREHENSIVE METABOLIC PANEL
ALBUMIN: 2.2 g/dL — AB (ref 3.5–5.2)
ALK PHOS: 60 U/L (ref 39–117)
ALT: 8 U/L (ref 0–53)
AST: 19 U/L (ref 0–37)
Anion gap: 11 (ref 5–15)
BUN: 12 mg/dL (ref 6–23)
CALCIUM: 8.9 mg/dL (ref 8.4–10.5)
CO2: 30 mEq/L (ref 19–32)
Chloride: 97 mEq/L (ref 96–112)
Creatinine, Ser: 1.03 mg/dL (ref 0.50–1.35)
GFR calc Af Amer: 82 mL/min — ABNORMAL LOW (ref >90)
GFR calc non Af Amer: 71 mL/min — ABNORMAL LOW (ref >90)
Glucose, Bld: 96 mg/dL (ref 70–99)
POTASSIUM: 3.7 meq/L (ref 3.7–5.3)
SODIUM: 138 meq/L (ref 137–147)
TOTAL PROTEIN: 8.6 g/dL — AB (ref 6.0–8.3)
Total Bilirubin: 0.3 mg/dL (ref 0.3–1.2)

## 2014-01-06 LAB — URINALYSIS, ROUTINE W REFLEX MICROSCOPIC
Bilirubin Urine: NEGATIVE
GLUCOSE, UA: NEGATIVE mg/dL
Hgb urine dipstick: NEGATIVE
KETONES UR: NEGATIVE mg/dL
LEUKOCYTES UA: NEGATIVE
NITRITE: NEGATIVE
PH: 7 (ref 5.0–8.0)
Protein, ur: NEGATIVE mg/dL
SPECIFIC GRAVITY, URINE: 1.009 (ref 1.005–1.030)
Urobilinogen, UA: 0.2 mg/dL (ref 0.0–1.0)

## 2014-01-06 LAB — CBC
HCT: 27.9 % — ABNORMAL LOW (ref 39.0–52.0)
Hemoglobin: 8.9 g/dL — ABNORMAL LOW (ref 13.0–17.0)
MCH: 24.3 pg — AB (ref 26.0–34.0)
MCHC: 31.9 g/dL (ref 30.0–36.0)
MCV: 76 fL — AB (ref 78.0–100.0)
Platelets: 333 10*3/uL (ref 150–400)
RBC: 3.67 MIL/uL — AB (ref 4.22–5.81)
RDW: 20.2 % — ABNORMAL HIGH (ref 11.5–15.5)
WBC: 5.7 10*3/uL (ref 4.0–10.5)

## 2014-01-06 LAB — I-STAT TROPONIN, ED: Troponin i, poc: 0.01 ng/mL (ref 0.00–0.08)

## 2014-01-06 LAB — DIFFERENTIAL
BASOS ABS: 0 10*3/uL (ref 0.0–0.1)
Basophils Relative: 0 % (ref 0–1)
EOS ABS: 0.1 10*3/uL (ref 0.0–0.7)
EOS PCT: 3 % (ref 0–5)
LYMPHS ABS: 1.2 10*3/uL (ref 0.7–4.0)
Lymphocytes Relative: 21 % (ref 12–46)
Monocytes Absolute: 0.7 10*3/uL (ref 0.1–1.0)
Monocytes Relative: 12 % (ref 3–12)
NEUTROS PCT: 64 % (ref 43–77)
Neutro Abs: 3.7 10*3/uL (ref 1.7–7.7)

## 2014-01-06 LAB — I-STAT CHEM 8, ED
BUN: 11 mg/dL (ref 6–23)
CALCIUM ION: 1.15 mmol/L (ref 1.13–1.30)
Chloride: 98 mEq/L (ref 96–112)
Creatinine, Ser: 1 mg/dL (ref 0.50–1.35)
Glucose, Bld: 100 mg/dL — ABNORMAL HIGH (ref 70–99)
HCT: 30 % — ABNORMAL LOW (ref 39.0–52.0)
Hemoglobin: 10.2 g/dL — ABNORMAL LOW (ref 13.0–17.0)
Potassium: 3.6 mEq/L — ABNORMAL LOW (ref 3.7–5.3)
Sodium: 139 mEq/L (ref 137–147)
TCO2: 31 mmol/L (ref 0–100)

## 2014-01-06 LAB — APTT: aPTT: 38 seconds — ABNORMAL HIGH (ref 24–37)

## 2014-01-06 MED ORDER — ACETAMINOPHEN 650 MG RE SUPP: 650.0000 mg | Freq: Four times a day (QID) | RECTAL | Status: DC | PRN

## 2014-01-06 MED ORDER — HYDROCHLOROTHIAZIDE 25 MG PO TABS
25.0000 mg | ORAL_TABLET | Freq: Every day | ORAL | Status: DC
  Filled 2014-01-06 (×2): qty 1

## 2014-01-06 MED ORDER — ONDANSETRON HCL 4 MG/2ML IJ SOLN: 4.0000 mg | Freq: Four times a day (QID) | INTRAMUSCULAR | Status: DC | PRN

## 2014-01-06 MED ORDER — ALBUTEROL SULFATE (2.5 MG/3ML) 0.083% IN NEBU: 2.5000 mg | INHALATION_SOLUTION | Freq: Four times a day (QID) | RESPIRATORY_TRACT | Status: DC | PRN

## 2014-01-06 MED ORDER — DOCUSATE SODIUM 100 MG PO CAPS
100.0000 mg | ORAL_CAPSULE | Freq: Every day | ORAL | Status: DC
  Administered 2014-01-07 – 2014-01-09 (×4): 100 mg via ORAL
  Filled 2014-01-06 (×3): qty 1

## 2014-01-06 MED ORDER — SODIUM CHLORIDE 0.9 % IJ SOLN
3.0000 mL | INTRAMUSCULAR | Status: DC | PRN
  Administered 2014-01-07: 3 mL via INTRAVENOUS

## 2014-01-06 MED ORDER — ONDANSETRON HCL 4 MG PO TABS: 4.0000 mg | ORAL_TABLET | Freq: Four times a day (QID) | ORAL | Status: DC | PRN

## 2014-01-06 MED ORDER — POLYETHYLENE GLYCOL 3350 17 G PO PACK
17.0000 g | PACK | Freq: Every day | ORAL | Status: DC | PRN
  Filled 2014-01-06: qty 1

## 2014-01-06 MED ORDER — LEVETIRACETAM IN NACL 500 MG/100ML IV SOLN
500.0000 mg | Freq: Once | INTRAVENOUS | Status: AC
  Administered 2014-01-06: 500 mg via INTRAVENOUS
  Filled 2014-01-06: qty 100

## 2014-01-06 MED ORDER — SODIUM CHLORIDE 0.9 % IV SOLN: 250.0000 mL | INTRAVENOUS | Status: DC | PRN

## 2014-01-06 MED ORDER — SERTRALINE HCL 25 MG PO TABS
25.0000 mg | ORAL_TABLET | Freq: Every day | ORAL | Status: DC
  Administered 2014-01-07 – 2014-01-09 (×3): 25 mg via ORAL
  Filled 2014-01-06 (×4): qty 1

## 2014-01-06 MED ORDER — ACETAMINOPHEN 325 MG PO TABS
650.0000 mg | ORAL_TABLET | Freq: Four times a day (QID) | ORAL | Status: DC | PRN
  Administered 2014-01-08 – 2014-01-09 (×2): 650 mg via ORAL
  Filled 2014-01-06 (×3): qty 2

## 2014-01-06 MED ORDER — FUROSEMIDE 20 MG PO TABS
20.0000 mg | ORAL_TABLET | Freq: Every day | ORAL | Status: DC
  Administered 2014-01-08 – 2014-01-09 (×2): 20 mg via ORAL
  Filled 2014-01-06 (×4): qty 1

## 2014-01-06 MED ORDER — DEXAMETHASONE SODIUM PHOSPHATE 10 MG/ML IJ SOLN
10.0000 mg | Freq: Once | INTRAMUSCULAR | Status: AC
  Administered 2014-01-06: 10 mg via INTRAVENOUS
  Filled 2014-01-06: qty 1

## 2014-01-06 MED ORDER — CARVEDILOL 6.25 MG PO TABS
6.2500 mg | ORAL_TABLET | Freq: Two times a day (BID) | ORAL | Status: DC
  Filled 2014-01-06 (×6): qty 1

## 2014-01-06 MED ORDER — SODIUM CHLORIDE 0.9 % IJ SOLN
3.0000 mL | Freq: Two times a day (BID) | INTRAMUSCULAR | Status: DC
  Administered 2014-01-07 – 2014-01-08 (×4): 3 mL via INTRAVENOUS

## 2014-01-06 MED ORDER — MORPHINE SULFATE 15 MG PO TABS
30.0000 mg | ORAL_TABLET | Freq: Two times a day (BID) | ORAL | Status: DC
  Administered 2014-01-06 – 2014-01-09 (×6): 30 mg via ORAL
  Filled 2014-01-06 (×6): qty 2

## 2014-01-06 MED ORDER — OXYCODONE HCL 5 MG PO TABS
15.0000 mg | ORAL_TABLET | ORAL | Status: DC | PRN
  Administered 2014-01-08: 15 mg via ORAL
  Filled 2014-01-06: qty 3

## 2014-01-06 MED ORDER — ALUM & MAG HYDROXIDE-SIMETH 200-200-20 MG/5ML PO SUSP: 30.0000 mL | Freq: Four times a day (QID) | ORAL | Status: DC | PRN

## 2014-01-06 MED ORDER — AMLODIPINE BESYLATE 10 MG PO TABS
10.0000 mg | ORAL_TABLET | Freq: Every day | ORAL | Status: DC
  Filled 2014-01-06 (×2): qty 1

## 2014-01-06 MED ORDER — SENNA 8.6 MG PO TABS
1.0000 | ORAL_TABLET | Freq: Two times a day (BID) | ORAL | Status: DC
  Administered 2014-01-06 – 2014-01-09 (×6): 8.6 mg via ORAL
  Filled 2014-01-06 (×7): qty 1

## 2014-01-06 MED ORDER — ALBUTEROL SULFATE HFA 108 (90 BASE) MCG/ACT IN AERS
2.0000 | INHALATION_SPRAY | Freq: Four times a day (QID) | RESPIRATORY_TRACT | Status: DC | PRN
Start: 2014-01-06 — End: 2014-01-06

## 2014-01-06 MED ORDER — LEVETIRACETAM 500 MG PO TABS
500.0000 mg | ORAL_TABLET | Freq: Two times a day (BID) | ORAL | Status: DC
  Administered 2014-01-06 – 2014-01-09 (×6): 500 mg via ORAL
  Filled 2014-01-06 (×7): qty 1

## 2014-01-06 NOTE — H&P (Addendum)
[facility] History and Physical  [name] OVF:[id number] DOB: 1940/11/16 DOA: 01/06/2014   PCP: [name], MD  Specialists: Dr Julien Nordmann  Chief Complaint: left leg spasms and inability to ambulate  HPI: [name] is a 73 y.o. male former smoker with non small cell lung cancer- adenocarcinoma, HTN, hyperlipidemia and bronchitis. He has been noting "spasms" or "jumping" of his left leg for the past few day and he subsequently has not been able to walk. He has had numbness in both feet but more in left then right which also started 2 days ago.  Per history obtained from him and his wife, he received chemo by the [facility] starting in July/Aug of last year stopping in 12/14 due to oral abscesses. Another treatment given after treating the abscess but due to recurrent abscess this Chemo was stopped. He was becoming weaker and he was started on hospice in late Aug.   He recently saw Dr Julien Nordmann on 12/24/13 and per the note, he was thought to be a good candidate for palliative radiotherapy  to the lesion on the right side of his chest and chemotherapy.   He is currently found to have a bleeding metastasis in the right frontal lobe which is hemorrhagic with surrounding vasogenic edema.    General: The patient denies anorexia, + weight loss initially but now gaining Cardiac: Denies chest pain, syncope, palpitations, pedal edema  Respiratory: Denies cough, + shortness of breath for many months progressively working placed on O2 when admitted to hospice a few wks now GI: Denies severe indigestion/heartburn, abdominal pain, nausea, vomiting, diarrhea - often gets constipation- last BM was 2 days ago GU: Denies hematuria, incontinence, dysuria  Musculoskeletal: Denies arthritis  Skin: Denies suspicious skin lesions Neurologic: Denies focal weakness or numbness, change in vision  Past Medical History  Diagnosis Date  . Bronchitis   . Glaucoma   . Heart disease   . Hyperlipidemia    . Hypertension   . CAD (coronary artery disease)     Remote cath ?1998.   . Tobacco abuse   . Cancer     lung and liver    Past Surgical History  Procedure Laterality Date  . None    . Cardiac catheterization      Social History: stopped smoking over a 36yr ago, does not drink alcohol Lives at home with wife.     No Known Allergies  Family History  Problem Relation Age of Onset  . Hyperlipidemia Mother   . Heart disease Mother   . Stroke Mother   . Hypertension Mother   . Kidney disease Mother   . Diabetes Mother   . Diabetes Father   . Hypertension Maternal Grandfather   . Hyperlipidemia Paternal Grandmother   . Diabetes Paternal Grandmother   . Heart attack Maternal Grandfather       Prior to Admission medications   Medication Sig Start Date End Date Taking? Authorizing Provider  albuterol (PROVENTIL HFA;VENTOLIN HFA) 108 (90 BASE) MCG/ACT inhaler Inhale 2 puffs into the lungs every 6 (six) hours as needed for wheezing or shortness of breath. 08/19/13  Yes Domenic Polite, MD  amLODipine (NORVASC) 10 MG tablet Take 10 mg by mouth daily with breakfast.    Yes Historical Provider, MD  Aspirin-Salicylamide-Caffeine (BC HEADACHE POWDER PO) Take 1 packet by mouth daily as needed (headache).   Yes Historical Provider, MD  carvedilol (COREG) 6.25 MG tablet Take 6.25 mg by mouth 2 (two) times daily with a meal.  Yes Historical Provider, MD  docusate sodium (COLACE) 100 MG capsule Take 100 mg by mouth daily.    Yes Historical Provider, MD  furosemide (LASIX) 20 MG tablet Take 20 mg by mouth daily after breakfast.    Yes Historical Provider, MD  hydrochlorothiazide (HYDRODIURIL) 25 MG tablet Take 25 mg by mouth daily with breakfast.    Yes Historical Provider, MD  morphine (MSIR) 30 MG tablet Take 30 mg by mouth 2 (two) times daily.   Yes Historical Provider, MD  ondansetron (ZOFRAN) 8 MG tablet Take 8 mg by mouth 2 (two) times daily as needed for nausea or vomiting.    Yes  Historical Provider, MD  oxyCODONE (ROXICODONE) 15 MG immediate release tablet Take 15 mg by mouth every 4 (four) hours as needed for pain.   Yes Historical Provider, MD  polyethylene glycol (MIRALAX / GLYCOLAX) packet Take 17 g by mouth daily as needed for moderate constipation.    Yes Historical Provider, MD  senna (SENOKOT) 8.6 MG TABS tablet Take 1 tablet by mouth 2 (two) times daily.    Yes Historical Provider, MD  sertraline (ZOLOFT) 25 MG tablet Take 25 mg by mouth daily with breakfast.    Yes Historical Provider, MD     Physical Exam: Filed Vitals:   01/06/14 1415 01/06/14 1430 01/06/14 1500 01/06/14 1516  BP: 115/55 106/55 126/57 113/62  Pulse: 54 56 58 59  Temp:    98.3 F (36.8 C)  TempSrc:    Oral  Resp: 14 15 17 16   SpO2: 97% 97% 98% 98%     General: AAO x 3 HEENT: Normocephalic and Atraumatic, Mucous membranes pink                PERRLA; EOM intact; No scleral icterus,                 Nares: Patent, Oropharynx: Clear, Fair Dentition                 Neck: FROM, no cervical lymphadenopathy, thyromegaly, carotid bruit or JVD;  Breasts: deferred CHEST WALL: No tenderness  CHEST: Normal respiration, clear to auscultation bilaterally  HEART: Regular rate and rhythm; no murmurs rubs or gallops  BACK: No kyphosis or scoliosis; no CVA tenderness  ABDOMEN: Positive Bowel Sounds, soft, non-tender; no masses, no organomegaly Rectal Exam: deferred EXTREMITIES: No cyanosis, clubbing, or edema Genitalia: not examined  SKIN:  no rash or ulceration  CNS: Alert and Oriented x 4, Nonfocal exam, CN 2-12 intact  Labs on Admission:  Basic Metabolic Panel:  Recent Labs Lab 01/02/14 0824 01/06/14 1110 01/06/14 1123  NA 141 138 139  K 3.5 3.7 3.6*  CL  --  97 98  CO2 34* 30  --   GLUCOSE 120 96 100*  BUN 13.2 12 11   CREATININE 1.2 1.03 1.00  CALCIUM 9.3 8.9  --    Liver Function Tests:  Recent Labs Lab 01/02/14 0824 01/06/14 1110  AST 20 19  ALT 6 8  ALKPHOS 64 60   BILITOT 0.32 0.3  PROT 8.8* 8.6*  ALBUMIN 2.1* 2.2*   No results found for this basename: LIPASE, AMYLASE,  in the last 168 hours No results found for this basename: AMMONIA,  in the last 168 hours CBC:  Recent Labs Lab 01/02/14 0824 01/06/14 1110 01/06/14 1123  WBC 5.8 5.7  --   NEUTROABS 3.8 3.7  --   HGB 7.3* 8.9* 10.2*  HCT 23.6* 27.9* 30.0*  MCV 71.2* 76.0*  --  PLT 391 333  --    Cardiac Enzymes: No results found for this basename: CKTOTAL, CKMB, CKMBINDEX, TROPONINI,  in the last 168 hours  BNP (last 3 results)  Recent Labs  05/16/13 2228  PROBNP 88.2   CBG: No results found for this basename: GLUCAP,  in the last 168 hours  Radiological Exams on Admission: Ct Head (brain) Wo Contrast  01/06/2014   CLINICAL DATA:  Fall, left-sided weakness, difficulty ambulating  EXAM: CT HEAD WITHOUT CONTRAST  TECHNIQUE: Contiguous axial images were obtained from the base of the skull through the vertex without contrast.  COMPARISON:  05/14/2008  FINDINGS: High right parietal hyperdense hemorrhage or underlying hemorrhagic lesion noted with surrounding white matter vasogenic edema. This measures 19 x 17 mm, image 24.  No other lesions demonstrated by noncontrast CT. In the setting of trauma this could represent a hemorrhagic contusion however a hemorrhagic brain lesion/metastasis would have a similar appearance. Patient has a known history of lung cancer.  Stable brain atrophy. Minor white matter microvascular ischemic change about the lateral ventricles. No hydrocephalus, midline shift, or significant herniation. No extra-axial fluid collection. Cisterns are patent. No definite cerebellar abnormality. Orbits are symmetric. Mastoids are clear. Minor scattered sinus mucosal thickening. No skull lesion or acute fracture.  IMPRESSION: 19 mm high right parietal hemorrhagic brain lesion with surrounding vasogenic edema, favor hemorrhagic metastasis given the history of known advanced lung  cancer.  These results were called by telephone at the time of interpretation on 01/06/2014 at 12:09 pm to Dr. Alfonzo Beers , who verbally acknowledged these results.   Electronically Signed   By: Daryll Brod M.D.   On: 01/06/2014 12:11    EKG: Independently reviewed. 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 - avoid all anticoagulants - evaluated by Neurosurgery and recommended Decadron and Keppra - monitor for 24-48 hrs and if his neurological symptoms remain stable, he can be discharged to have outpt radiation done if needed  Active Problems:   HTN (hypertension) - cont Coreg, Norvasc, HCTZ - also on Lasix- will continue for now    Protein-calorie malnutrition, severe - start Ensure     Stage 4 lung cancer, right - f/u with Dr Julien Nordmann for further discussions for palliative therapy    Consulted: neurosurgery  Code Status: Full code Family Communication: his wife  DVT Prophylaxis:SCDs  Time spent: > 35min  [place], MD [facility]  If 7PM-7AM, please contact night-coverage [email or web address] 01/06/2014, 3:28 PM

## 2014-01-06 NOTE — Progress Notes (Signed)
[facility] ([facility])-K Alford Highland RN Pt to ED via EMS from home with c/o L sided weakness, L sided "twitching", and decreased feeling in the L lower leg.  Pt seen at bedside, had just returned from a head CT scan. Wife Delcie Roch and cousin Izell [place] present during visit. Pt alert, interactive, denied pain or discomfort. Pt reported lack of feeling to his L leg, from the ankle up to the thigh. He was able to move his L arm, but had difficulty directing its movement. Delcie Roch reports that pt has been forgetful and having some dizziness the past "few days". No facial droop, difficulty swallowing or speech changes noted. ED physician Dr. Canary Brim in during visit to discuss the head CT results. CT positive for a bleed. Neurosurgery to be consulted. Pt to be admitted. [facility] will continue to follow daily. [facility] team aware of admission.  Please call [phone] with any hospice needs. Thank you Flo Shanks RN, BSN, [facility]) RN Liaison [phone]

## 2014-01-06 NOTE — ED Notes (Signed)
Attempted report 

## 2014-01-06 NOTE — ED Notes (Signed)
Pt arrives via EMS from home with left sided weakness and tremors to left leg that began 4 days ago. Pt has been having difficulty feeding self and walking. Pt received flu shot Saturday. Pt speech clear, no facial droop, left arm drift noted, left leg drift and limb ataxia noted. Pt awake, alert. 20g L FA, cbg 96.

## 2014-01-06 NOTE — Consult Note (Signed)
CC:  Chief Complaint  Patient presents with  . Extremity Weakness    HPI: [name] is a 73 y.o. male presenting to the ED with several days of worsening left-sided weakness. He and his wife state that this past weekend he noted some difficulty with weakness of his left arm, and some instability while walking. He notes some numbness of his left leg as well as a sensation of "bone-on-bone" on his left chest wall. He did not have associated HA, but does c/o intermittent episodes of dizziness over the last few days. He denies any changes in vision, nor does he have any right-sided symptoms. He does state that he has had some intermittent "tremors" or "cramps" of the left leg over the last 2 days.  Of note, he does have a history of metastatic non-small cell lung CA, treated with 2 cycles of chemotherapy. He as been seen by Dr. Earlie Server approximately 2 weeks ago and has previously undergone surveilance brain MRI a few months ago, reportedly negative.  PMH: Past Medical History  Diagnosis Date  . Bronchitis   . Glaucoma   . Heart disease   . Hyperlipidemia   . Hypertension   . CAD (coronary artery disease)     Remote cath ?1998.   . Tobacco abuse   . Cancer     lung and liver    PSH: Past Surgical History  Procedure Laterality Date  . None    . Cardiac catheterization      SH: History  Substance Use Topics  . Smoking status: Former Smoker -- 0.30 packs/day for 54 years  . Smokeless tobacco: Not on file     Comment: smokes a lot less than he used to  . Alcohol Use: No    MEDS: Prior to Admission medications   Medication Sig Start Date End Date Taking? Authorizing Provider  albuterol (PROVENTIL HFA;VENTOLIN HFA) 108 (90 BASE) MCG/ACT inhaler Inhale 2 puffs into the lungs every 6 (six) hours as needed for wheezing or shortness of breath. 08/19/13  Yes Domenic Polite, MD  amLODipine (NORVASC) 10 MG tablet Take 10 mg by mouth daily with breakfast.    Yes Historical Provider,  MD  Aspirin-Salicylamide-Caffeine (BC HEADACHE POWDER PO) Take 1 packet by mouth daily as needed (headache).   Yes Historical Provider, MD  carvedilol (COREG) 6.25 MG tablet Take 6.25 mg by mouth 2 (two) times daily with a meal.   Yes Historical Provider, MD  docusate sodium (COLACE) 100 MG capsule Take 100 mg by mouth daily.    Yes Historical Provider, MD  furosemide (LASIX) 20 MG tablet Take 20 mg by mouth daily after breakfast.    Yes Historical Provider, MD  hydrochlorothiazide (HYDRODIURIL) 25 MG tablet Take 25 mg by mouth daily with breakfast.    Yes Historical Provider, MD  morphine (MSIR) 30 MG tablet Take 30 mg by mouth 2 (two) times daily.   Yes Historical Provider, MD  ondansetron (ZOFRAN) 8 MG tablet Take 8 mg by mouth 2 (two) times daily as needed for nausea or vomiting.    Yes Historical Provider, MD  oxyCODONE (ROXICODONE) 15 MG immediate release tablet Take 15 mg by mouth every 4 (four) hours as needed for pain.   Yes Historical Provider, MD  polyethylene glycol (MIRALAX / GLYCOLAX) packet Take 17 g by mouth daily as needed for moderate constipation.    Yes Historical Provider, MD  senna (SENOKOT) 8.6 MG TABS tablet Take 1 tablet by mouth 2 (two) times daily.  Yes Historical Provider, MD  sertraline (ZOLOFT) 25 MG tablet Take 25 mg by mouth daily with breakfast.    Yes Historical Provider, MD    ALLERGY: No Known Allergies  ROS: Review of Systems  Constitutional: Negative for fever and chills.  HENT: Negative for hearing loss and nosebleeds.   Eyes: Negative for blurred vision.  Respiratory: Positive for cough.   Cardiovascular: Positive for chest pain and leg swelling. Negative for palpitations.  Gastrointestinal: Negative for heartburn, nausea and vomiting.  Genitourinary: Negative for dysuria.  Musculoskeletal: Positive for falls. Negative for myalgias.  Skin: Negative for rash.  Neurological: Positive for dizziness, sensory change and focal weakness. Negative for  tingling, loss of consciousness and headaches.  Endo/Heme/Allergies: Does not bruise/bleed easily.  Psychiatric/Behavioral: The patient is not nervous/anxious.     NEUROLOGIC EXAM: Awake, alert, oriented Memory and concentration grossly intact Speech fluent, appropriate CN grossly intact Motor exam: Upper Extremities Deltoid Bicep Tricep Grip  Right 5/5 5/5 5/5 5/5  Left 5/5 5/5 5/5 5/5   Lower Extremity IP Quad PF DF EHL  Right 5/5 5/5 5/5 5/5 5/5  Left 5/5 5/5 5/5 4-/5 4/5    Left pronator drift (+)  Sensation decreased to LT in LLE  IMGAING: Non-contrast CT head demonstrates ~1.5cm posterior left frontal hemorrhagic lesions with significant surrounding edema. No HCP, MLS is seen.  IMPRESSION: - 73 y.o. male with metastatic NSCLCA with right frontal hemorrhage lesion and mild left hemiparesis. Unclear whether "tremors" represent focal motor SZ or muscle spasms  PLAN: - Admit for observation - Start decadron, 10mg  x1 then 4mg  PO Q6Hrs - Start Keppra 500mg  PO BID - If he remains stable, can likely be d/c'ed home in next 24-48 hrs  I spoke with the pt and his wife regarding the CT findings. General treatment options for the tumor were discussed, as well as the treatment of the swelling with steroids. Options include no therapy, surgical resection, or RT. If the decision is made to pursue treatment of the lesion, my recommendation would be for stereotactic radiosurgery as surgical resection does carry with it a significant risk of worsening hemiparesis. I have already spoken to Dr. Tammi Klippel from [facility] who will plan on seeing the pt after his discharge on an outpatient basis for [facility] treatment in the near future. He will also be added on for discussion at tomorrow's multi-disciplinary neuro-oncology conference. All the patient's and his wife's questions were answered.

## 2014-01-06 NOTE — ED Provider Notes (Signed)
CSN: [id number]     Arrival date & time    History   First MD Initiated Contact with Patient 01/06/14 1137     Chief Complaint  Patient presents with  . Extremity Weakness     (Consider location/radiation/quality/duration/timing/severity/associated sxs/prior Treatment) HPI Pt presenting with c/o left sided weakness of arm and leg, some tremors with trying to use arm and leg.  Has been having difficulty feeding himself and walking.  Symptoms began 4 days ago.  No changes in vision or speech.  Pt has hx of lung cancer with mets to liver.  Was recently referred by [facility] to hospice, then had consultation with cancer center at [facility] last week.  He is not actively on treatment now while awaiting repeat scans to stage his cancer again.  Had not had neurologic symptoms prior to last week.  There are no other associated systemic symptoms, there are no other alleviating or modifying factors.   Past Medical History  Diagnosis Date  . Bronchitis   . Glaucoma   . Heart disease   . Hyperlipidemia   . Hypertension   . CAD (coronary artery disease)     Remote cath ?1998.   . Tobacco abuse   . Cancer     lung and liver   Past Surgical History  Procedure Laterality Date  . None    . Cardiac catheterization     Family History  Problem Relation Age of Onset  . Hyperlipidemia Mother   . Heart disease Mother   . Stroke Mother   . Hypertension Mother   . Kidney disease Mother   . Diabetes Mother   . Diabetes Father   . Hypertension Maternal Grandfather   . Hyperlipidemia Paternal Grandmother   . Diabetes Paternal Grandmother   . Heart attack Maternal Grandfather    History  Substance Use Topics  . Smoking status: Former Smoker -- 0.30 packs/day for 54 years  . Smokeless tobacco: Not on file     Comment: smokes a lot less than he used to  . Alcohol Use: No    Review of Systems ROS reviewed and all otherwise negative except for mentioned in HPI    Allergies  Review of  patient's allergies indicates no known allergies.  Home Medications   Prior to Admission medications   Medication Sig Start Date End Date Taking? Authorizing Provider  albuterol (PROVENTIL HFA;VENTOLIN HFA) 108 (90 BASE) MCG/ACT inhaler Inhale 2 puffs into the lungs every 6 (six) hours as needed for wheezing or shortness of breath. 08/19/13  Yes Domenic Polite, MD  amLODipine (NORVASC) 10 MG tablet Take 10 mg by mouth daily with breakfast.    Yes Historical Provider, MD  Aspirin-Salicylamide-Caffeine (BC HEADACHE POWDER PO) Take 1 packet by mouth daily as needed (headache).   Yes Historical Provider, MD  carvedilol (COREG) 6.25 MG tablet Take 6.25 mg by mouth 2 (two) times daily with a meal.   Yes Historical Provider, MD  docusate sodium (COLACE) 100 MG capsule Take 100 mg by mouth daily.    Yes Historical Provider, MD  furosemide (LASIX) 20 MG tablet Take 20 mg by mouth daily after breakfast.    Yes Historical Provider, MD  hydrochlorothiazide (HYDRODIURIL) 25 MG tablet Take 25 mg by mouth daily with breakfast.    Yes Historical Provider, MD  morphine (MSIR) 30 MG tablet Take 30 mg by mouth 2 (two) times daily.   Yes Historical Provider, MD  ondansetron (ZOFRAN) 8 MG tablet Take 8 mg  by mouth 2 (two) times daily as needed for nausea or vomiting.    Yes Historical Provider, MD  oxyCODONE (ROXICODONE) 15 MG immediate release tablet Take 15 mg by mouth every 4 (four) hours as needed for pain.   Yes Historical Provider, MD  polyethylene glycol (MIRALAX / GLYCOLAX) packet Take 17 g by mouth daily as needed for moderate constipation.    Yes Historical Provider, MD  senna (SENOKOT) 8.6 MG TABS tablet Take 1 tablet by mouth 2 (two) times daily.    Yes Historical Provider, MD  sertraline (ZOLOFT) 25 MG tablet Take 25 mg by mouth daily with breakfast.    Yes Historical Provider, MD   BP 113/62  Pulse 59  Temp(Src) 98.3 F (36.8 C) (Oral)  Resp 16  SpO2 98% Vitals reviewed Physical Exam Physical  Examination: General appearance - alert, chronically ill appearing, and in no distress Mental status - alert, oriented to person, place, and time Eyes - pupils equal and reactive, extraocular eye movements intact Mouth - mucous membranes moist, pharynx normal without lesions Chest - clear to auscultation, no wheezes, rales or rhonchi, symmetric air entry Heart - normal rate, regular rhythm, normal S1, S2, no murmurs, rubs, clicks or gallops Abdomen - soft, nontender, nondistended, no masses or organomegaly Neurological - alert, oriented x 3, normal speech, decreased sensation to left arm and lower extremity, pronator drift on left Extremities - peripheral pulses normal, no pedal edema, no clubbing or cyanosis Skin - normal coloration and turgor, no rashes  ED Course  Procedures (including critical care time)  1:10 PM d/w Dr. Kathyrn Sheriff, neurosurgery, he will come to the ED and seen patient.  I have discussed results with patient, family and the hospice nurse who are at the bedside.    1:13 PM we have confirmed with patient that he is a full code  2:42 PM d/w [facility]ad for admission,  Dr. Wynelle Cleveland.   Labs Review Labs Reviewed  PROTIME-INR - Abnormal; Notable for the following:    Prothrombin Time 15.3 (*)    All other components within normal limits  APTT - Abnormal; Notable for the following:    aPTT 38 (*)    All other components within normal limits  CBC - Abnormal; Notable for the following:    RBC 3.67 (*)    Hemoglobin 8.9 (*)    HCT 27.9 (*)    MCV 76.0 (*)    MCH 24.3 (*)    RDW 20.2 (*)    All other components within normal limits  COMPREHENSIVE METABOLIC PANEL - Abnormal; Notable for the following:    Total Protein 8.6 (*)    Albumin 2.2 (*)    GFR calc non Af Amer 71 (*)    GFR calc Af Amer 82 (*)    All other components within normal limits  I-STAT CHEM 8, ED - Abnormal; Notable for the following:    Potassium 3.6 (*)    Glucose, Bld 100 (*)    Hemoglobin 10.2 (*)     HCT 30.0 (*)    All other components within normal limits  DIFFERENTIAL  URINALYSIS, ROUTINE W REFLEX MICROSCOPIC  I-STAT TROPOININ, ED    Imaging Review Ct Head (brain) Wo Contrast  01/06/2014   CLINICAL DATA:  Fall, left-sided weakness, difficulty ambulating  EXAM: CT HEAD WITHOUT CONTRAST  TECHNIQUE: Contiguous axial images were obtained from the base of the skull through the vertex without contrast.  COMPARISON:  05/14/2008  FINDINGS: High right parietal hyperdense hemorrhage or  underlying hemorrhagic lesion noted with surrounding white matter vasogenic edema. This measures 19 x 17 mm, image 24.  No other lesions demonstrated by noncontrast CT. In the setting of trauma this could represent a hemorrhagic contusion however a hemorrhagic brain lesion/metastasis would have a similar appearance. Patient has a known history of lung cancer.  Stable brain atrophy. Minor white matter microvascular ischemic change about the lateral ventricles. No hydrocephalus, midline shift, or significant herniation. No extra-axial fluid collection. Cisterns are patent. No definite cerebellar abnormality. Orbits are symmetric. Mastoids are clear. Minor scattered sinus mucosal thickening. No skull lesion or acute fracture.  IMPRESSION: 19 mm high right parietal hemorrhagic brain lesion with surrounding vasogenic edema, favor hemorrhagic metastasis given the history of known advanced lung cancer.  These results were called by telephone at the time of interpretation on 01/06/2014 at 12:09 pm to Dr. Alfonzo Beers , who verbally acknowledged these results.   Electronically Signed   By: Daryll Brod M.D.   On: 01/06/2014 12:11     EKG Interpretation   Date/Time:  Tuesday January 06 2014 10:55:00 EDT Ventricular Rate:  58 PR Interval:  185 QRS Duration: 109 QT Interval:  434 QTC Calculation: 426 R Axis:   -51 Text Interpretation:  Sinus rhythm Atrial premature complex LAD, consider  left anterior fascicular block Since  previous tracing axis has shifted  leftward Confirmed by Canary Brim  MD, Makih Stefanko [phone]) on 01/06/2014 3:26:48 PM      MDM   Final diagnoses:  Brain metastasis  Essential hypertension  Protein-calorie malnutrition, severe  Stage 4 lung cancer, left    Pt presenting with c/o weakness and tremor on left side over the past 4 days.  Pt has new brain met on CT with some hemorrhage.  Pt has been seen by neurosurgery, started on decadron and keppra, will admit to medical service.  Results d/w patient, family and hospice nurse at bedside.     Threasa Beards, MD 01/06/14 [id number]

## 2014-01-07 LAB — CBC
HCT: 27.1 % — ABNORMAL LOW (ref 39.0–52.0)
Hemoglobin: 8.7 g/dL — ABNORMAL LOW (ref 13.0–17.0)
MCH: 23.5 pg — AB (ref 26.0–34.0)
MCHC: 32.1 g/dL (ref 30.0–36.0)
MCV: 73 fL — ABNORMAL LOW (ref 78.0–100.0)
Platelets: 367 10*3/uL (ref 150–400)
RBC: 3.71 MIL/uL — AB (ref 4.22–5.81)
RDW: 20.4 % — AB (ref 11.5–15.5)
WBC: 4.2 10*3/uL (ref 4.0–10.5)

## 2014-01-07 LAB — BASIC METABOLIC PANEL
Anion gap: 13 (ref 5–15)
BUN: 21 mg/dL (ref 6–23)
CO2: 27 mEq/L (ref 19–32)
Calcium: 9.2 mg/dL (ref 8.4–10.5)
Chloride: 96 mEq/L (ref 96–112)
Creatinine, Ser: 1.07 mg/dL (ref 0.50–1.35)
GFR calc Af Amer: 78 mL/min — ABNORMAL LOW (ref >90)
GFR, EST NON AFRICAN AMERICAN: 67 mL/min — AB (ref >90)
Glucose, Bld: 149 mg/dL — ABNORMAL HIGH (ref 70–99)
POTASSIUM: 3.9 meq/L (ref 3.7–5.3)
SODIUM: 136 meq/L — AB (ref 137–147)

## 2014-01-07 MED ORDER — DEXAMETHASONE 4 MG PO TABS
4.0000 mg | ORAL_TABLET | Freq: Four times a day (QID) | ORAL | Status: DC
  Administered 2014-01-07: 4 mg via ORAL
  Filled 2014-01-07 (×5): qty 1

## 2014-01-07 MED ORDER — PANTOPRAZOLE SODIUM 40 MG PO TBEC
40.0000 mg | DELAYED_RELEASE_TABLET | Freq: Every day | ORAL | Status: DC
  Administered 2014-01-07 – 2014-01-09 (×3): 40 mg via ORAL
  Filled 2014-01-07 (×3): qty 1

## 2014-01-07 MED ORDER — DEXAMETHASONE 4 MG PO TABS
4.0000 mg | ORAL_TABLET | Freq: Four times a day (QID) | ORAL | Status: DC
  Administered 2014-01-07 – 2014-01-09 (×9): 4 mg via ORAL
  Filled 2014-01-07 (×12): qty 1

## 2014-01-07 MED ORDER — CETYLPYRIDINIUM CHLORIDE 0.05 % MT LIQD
7.0000 mL | Freq: Two times a day (BID) | OROMUCOSAL | Status: DC
  Administered 2014-01-07 – 2014-01-09 (×2): 7 mL via OROMUCOSAL

## 2014-01-07 NOTE — Progress Notes (Signed)
This patient's case was discussed at the neuro-oncology conference today. He does appear to be a good candidate for stereotactic radiosurgery to the right frontal lesion. If he remains stable, he can be discharged on steroids and keppra with f/u in the radiation oncology clinic - Dr. Tammi Klippel for further discussion of SRS.

## 2014-01-07 NOTE — Progress Notes (Signed)
RM 3W 03 Mina Saville                                           [facility] MSW Note:  Pt was alert, oriented, pleasant and engaged with prompting. Spouse was at the bedside. She is realistic about pt's status but just worn out by pt's care as she reports that he can be demanding. Actively listened as she vented. SW will continue to extend support to both. Lesle Reek, LCSW

## 2014-01-07 NOTE — Evaluation (Signed)
Clinical/Bedside Swallow Evaluation Patient Details  Name: [name] MRN: [id number] Date of Birth: [birth year]  Today's Date: 01/07/2014 Time: 0804-0830 SLP Time Calculation (min): 26 min  Past Medical History:  Past Medical History  Diagnosis Date  . Bronchitis   . Glaucoma   . Heart disease   . Hyperlipidemia   . Hypertension   . CAD (coronary artery disease)     Remote cath ?1998.   . Tobacco abuse   . Cancer     lung and liver  . Glaucoma (increased eye pressure)    Past Surgical History:  Past Surgical History  Procedure Laterality Date  . None    . Cardiac catheterization     HPI:  [name] is a 73 y.o. male former smoker with non small cell lung cancer- adenocarcinoma, HTN, hyperlipidemia and bronchitis hx. He reported to MD noting "spasms" or "jumping" of his left leg for the past few day and he subsequently has not been able to walk. He has had numbness in both feet but more in left then right.  Per MD note, pt he received chemo by the [facility] starting in July/Aug of last year stopping in 12/14 due to oral abscesses with reattempt after oral treatement with recurrence of oral abscesses. Per MD Note, pt has been becoming weaker and he was started on hospice in late Aug.  BSE ordered.  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     Assessment / Plan / Recommendation Clinical Impression  Pt presents with mild oral dysphagia due to edentulous status resulting in decreased "mastication" ability and oral residuals in right sulci.  No overt symptoms of aspiration but pt consumes po at very rapid rate - not clearing mouth before adding more.  Advised pt to importance of slowing rate of intake for airway protection- especially given possible pulmonary ramifications with his COPD/lung cancer.  Pt verbalized understanding.  SlP to change diet to dys2/thin for increased ease of pt intake and will follow up x1 to assure  tolerance and to educate pt/family.    Aspiration Risk  Mild    Diet Recommendation Thin liquid;Dysphagia 2 (Fine chop)   Liquid Administration via: Cup;Straw Medication Administration: Whole meds with liquid Supervision: Patient able to self feed Compensations: Slow rate;Small sips/bites;Check for pocketing Postural Changes and/or Swallow Maneuvers: Seated upright 90 degrees;Upright 30-60 min after meal    Other  Recommendations Oral Care Recommendations: Oral care BID   Follow Up Recommendations       Frequency and Duration min 1 x/week  1 week   Pertinent Vitals/Pain Afebrile, decreased     Swallow Study Prior Functional Status   pt denies h/o dysphagia except need to eat soft foods due to edentulous    General Date of Onset: 01/07/14 HPI: [name] is a 73 y.o. male former smoker with non small cell lung cancer- adenocarcinoma, HTN, hyperlipidemia and bronchitis hx. He reported to MD noting "spasms" or "jumping" of his left leg for the past few day and he subsequently has not been able to walk. He has had numbness in both feet but more in left then right.  Per MD note, pt he received chemo by the [facility] starting in July/Aug of last year stopping in 12/14 due to oral abscesses with reattempt after oral treatement with recurrence of oral abscesses. Per MD Note, pt has been becoming weaker and he was started on hospice in late Aug.  BSE ordered.  Imaging  studies showed suspected hemmorhagic metastatic brain lesion high right parietal region.   Type of Study: Bedside swallow evaluation Diet Prior to this Study: Regular;Thin liquids Temperature Spikes Noted: No Respiratory Status: Nasal cannula History of Recent Intubation: No Behavior/Cognition: Alert;Cooperative;Pleasant mood Oral Cavity - Dentition: Edentulous Self-Feeding Abilities: Able to feed self;Needs set up Patient Positioning: Upright in bed Baseline Vocal Quality: Clear Volitional Cough: Strong Volitional  Swallow: Able to elicit    Oral/Motor/Sensory Function Overall Oral Motor/Sensory Function:  (no severe gross focal CN deficits, pt with surprisingly mildly impaired movement of right facial/labial area and oral pocking on rightt)   Ice Chips Ice chips: Not tested   Thin Liquid Thin Liquid: Within functional limits Presentation: Straw    Nectar Thick Nectar Thick Liquid: Not tested   Honey Thick Honey Thick Liquid: Not tested   Puree Puree: Not tested   Solid   GO    Solid: Impaired Presentation: Self Fed;Spoon Oral Phase Functional Implications: Right lateral sulci pocketing Other Comments: "mastication" adequate but pt requested food to be cut up- oral pocketing mildly noted on right - clearing independently       Luanna Salk, [place] [facility] [place]

## 2014-01-07 NOTE — Evaluation (Signed)
Occupational Therapy Evaluation Patient Details Name: [name] MRN: [id number] DOB: Apr 01, 1941 Today's Date: 01/07/2014    History of Present Illness 73 yo male admitted with LT leg jumping and spasms with several days unable to walk. Pt with rt parietal hemorrhagic with vasogenic edema. Lt hemiparesis  CT(+) LT frontal hemorrhagic lesions ~1.5 cm PMH: lung CA stage IV and liver , HTN, glaucoma, CAD,    Clinical Impression   Pt is currently hospice care and will not be eligible for therapy services in addition to hospice care. CM - please max out hospice benefits. Pt and wife requesting to d/c home. Wife able to provide minimal (A). Pt will have goals for w/c level transfers at home. OT to follow acutely for basic transfer and training family in basic transfer.     Follow Up Recommendations  No OT follow up;Other (comment) (need to [place])    Equipment Recommendations  3 in 1 bedside comode;Wheelchair (measurements OT);Wheelchair cushion (measurements OT)    Recommendations for Other Services       Precautions / Restrictions Precautions Precautions: Fall      Mobility Bed Mobility               General bed mobility comments: in bathroom on arrival  Transfers Overall transfer level: Needs assistance Equipment used: Rolling walker (2 wheeled) Transfers: Sit to/from Stand Sit to Stand: Mod assist         General transfer comment: cues for hand placement and RT weight shift    Balance Overall balance assessment: Needs assistance Sitting-balance support: Bilateral upper extremity supported;Feet supported Sitting balance-Leahy Scale: Poor   Postural control: Left lateral lean Standing balance support: Bilateral upper extremity supported;During functional activity Standing balance-Leahy Scale: Poor                              ADL Overall ADL's : Needs assistance/impaired     Grooming: Wash/dry face;Wash/dry  hands;Moderate assistance;Standing Grooming Details (indicate cue type and reason): Requires (A) to balance unable to compelte with bil UE                 Toilet Transfer: Moderate assistance;Ambulation;BSC;RW Toilet Transfer Details (indicate cue type and reason): Pt with LT lateral lean on BSC.  Toileting- Clothing Manipulation and Hygiene: Moderate assistance;Sit to/from stand Toileting - Clothing Manipulation Details (indicate cue type and reason): pt attempting to wipe and strong LT lean. pt unaware of LT UE placement      Functional mobility during ADLs: Maximal assistance;Rolling walker General ADL Comments: PT demonstrates progressive weakness during session requiring more assistance toward end of session. pt with LOB to the LT and no ability to correct. Pt verbalizes deficits with LT UE.      Vision                 Additional Comments: Pt able to reach and touch hand with RT UE with direct central vision . pt undershooting with LT UE. to be further assessed with functional task    Perception Perception Perception Tested?: Yes Perception Deficits: Inattention/neglect Inattention/Neglect: Does not attend to left side of body   Praxis Praxis Praxis tested?: Deficits Deficits: Limb apraxia    Pertinent Vitals/Pain Pain Assessment: 0-10 Pain Score: 8  Pain Location: Rt ankle with tactile input. slight pain with dorsiflexion Pain Intervention(s): Repositioned     Hand Dominance Right   Extremity/Trunk Assessment Upper Extremity Assessment Upper Extremity  Assessment: LUE deficits/detail LUE Deficits / Details: pt demonstrates uncontrolled motor planning and inability to control strength when grabbing for object. Pt squeezing and undershooting. pt reports "thats how this happen. I was reaching for the table to go to the bathroom adn missed it" LUE Sensation: decreased light touch LUE Coordination: decreased fine motor;decreased gross motor   Lower Extremity  Assessment Lower Extremity Assessment: Defer to PT evaluation   Cervical / Trunk Assessment Cervical / Trunk Assessment: Normal   Communication Communication Communication: No difficulties   Cognition Arousal/Alertness: Awake/alert Behavior During Therapy: WFL for tasks assessed/performed Overall Cognitive Status: Impaired/Different from baseline Area of Impairment: Memory;Following commands;Safety/judgement;Awareness     Memory: Decreased short-term memory Following Commands: Follows one step commands with increased time Safety/Judgement: Decreased awareness of safety;Decreased awareness of deficits Awareness: Emergent   General Comments: Pt reports being able to complete transfers without (A) demonstrating decr awareness to LT lateral lean and LOB   General Comments       Exercises       Shoulder Instructions      Home Living Family/patient expects to be discharged to:: Private residence Living Arrangements: Spouse/significant other Available Help at Discharge: Family Type of Home: House Home Access: Stairs to enter Technical brewer of Steps: 10-12   Home Layout: Two level Alternate Level Stairs-Number of Steps: 10   Bathroom Shower/Tub: Tub/shower unit Shower/tub characteristics: Architectural technologist: Standard     Home Equipment: Environmental consultant - 2 wheels;Cane - single point;Tub bench;Grab bars - toilet;Grab bars - tub/shower;Hand held shower head;Wheelchair - manual   Additional Comments: wife available 24/7 however currently with  r/o cervical cancer and hx of x3 CA . Wife able to provide min (A) at this time      Prior Functioning/Environment Level of Independence: Needs assistance    ADL's / Homemaking Assistance Needed: hospice aid and rn coming x2 days a week to help with adls.         OT Diagnosis: Cognitive deficits;Generalized weakness;Paresis;Ataxia   OT Problem List: Decreased strength;Decreased activity tolerance;Decreased range of  motion;Impaired balance (sitting and/or standing);Decreased cognition;Decreased safety awareness;Decreased knowledge of use of DME or AE;Decreased knowledge of precautions;Pain;Impaired UE functional use   OT Treatment/Interventions: Self-care/ADL training;Therapeutic exercise;Neuromuscular education;DME and/or AE instruction;Therapeutic activities;Cognitive remediation/compensation;Balance training;Patient/family education    OT Goals(Current goals can be found in the care plan section) Acute Rehab OT Goals Patient Stated Goal: to return home and get over all this  OT Goal Formulation: With patient/family Time For Goal Achievement: 01/21/14 Potential to Achieve Goals: Good  OT Frequency: Min 2X/week   Barriers to D/C: Decreased caregiver support  wife present but with minimal (A) level . Extended family live 2 hours away per wife.        Co-evaluation              End of Session Equipment Utilized During Treatment: Gait belt;Rolling walker Nurse Communication: Mobility status;Precautions  Activity Tolerance: Patient tolerated treatment well Patient left: in chair;with call bell/phone within reach;with family/visitor present   Time: 7673-4193 OT Time Calculation (min): 27 min Charges:  OT General Charges $OT Visit: 1 Procedure OT Evaluation $Initial OT Evaluation Tier I: 1 Procedure OT Treatments $Self Care/Home Management : 8-22 mins G-Codes:    Peri Maris 02/03/2014, 4:15 PM Pager: [phone]

## 2014-01-07 NOTE — Progress Notes (Signed)
Visited pt at suggestion of care coordinator. Pt was with wife, son-in-law, two grandsons, and had other visitors as well. He said he had been visited by a chaplain earlier today, and had received a visit from a bishop from his church. Chaplain let him know that chaplains are always available should need arise.     01/07/14 1419  Clinical Encounter Type  Visited With Patient and family together  Visit Type Initial   Mariah Milling, Chaplain 01/07/2014 2:21 PM

## 2014-01-07 NOTE — Progress Notes (Signed)
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  -needs to follow up with Dr Tammi Klippel for radiation therapy.  -Start decadron 4 mg every 6 hour. Will add protonix GI prophylaxis.   HTN (hypertension)  - cont Coreg,  -Hold Norvasc, HCTZ  SBP soft.  - also on Lasix- will continue for now   Protein-calorie malnutrition, severe  - Continue with Ensure   Stage 4 lung cancer, right  - f/u with Dr Julien Nordmann for further discussions for palliative therapy  Anemia; stable monitor.   Code Status: Full Code.  Family Communication: Care discussed with patient Disposition Plan: home in 24 hours. PT, OT consulted.    Consultants:  neurosurgery  Procedures:  none  Antibiotics:  none  HPI/Subjective: Denies headaches, no worsening dyspnea.  No worsening    Objective: Filed Vitals:   01/07/14 0537  BP: 106/63  Pulse: 56  Temp: 97.3 F (36.3 C)  Resp: 18    Intake/Output Summary (Last 24 hours) at 01/07/14 3009 Last data filed at 01/06/14 1350  Gross per 24 hour  Intake      0 ml  Output    700 ml  Net   -700 ml   There were no vitals filed for this visit.  Exam:   General:  Alert in no distress.   Cardiovascular: S 1, S 2 RRR  Respiratory: CTA  Abdomen: Bs present, soft, Nt  Musculoskeletal: no edema.   Neuro; left side weakness 4/5  Data Reviewed: Basic Metabolic Panel:  Recent Labs Lab 01/02/14 0824 01/06/14 1110 01/06/14 1123 01/07/14 0530  NA 141 138 139 136*  K 3.5 3.7 3.6* 3.9  CL  --  97 98 96  CO2 34* 30  --  27  GLUCOSE 120 96 100* 149*  BUN 13.2 12 11 21   CREATININE 1.2 1.03 1.00 1.07  CALCIUM 9.3 8.9  --  9.2   Liver Function Tests:  Recent Labs Lab  01/02/14 0824 01/06/14 1110  AST 20 19  ALT 6 8  ALKPHOS 64 60  BILITOT 0.32 0.3  PROT 8.8* 8.6*  ALBUMIN 2.1* 2.2*   No results found for this basename: LIPASE, AMYLASE,  in the last 168 hours No results found for this basename: AMMONIA,  in the last 168 hours CBC:  Recent Labs Lab 01/02/14 0824 01/06/14 1110 01/06/14 1123 01/07/14 0530  WBC 5.8 5.7  --  4.2  NEUTROABS 3.8 3.7  --   --   HGB 7.3* 8.9* 10.2* 8.7*  HCT 23.6* 27.9* 30.0* 27.1*  MCV 71.2* 76.0*  --  73.0*  PLT 391 333  --  367   Cardiac Enzymes: No results found for this basename: CKTOTAL, CKMB, CKMBINDEX, TROPONINI,  in the last 168 hours BNP (last 3 results)  Recent Labs  05/16/13 2228  PROBNP 88.2   CBG: No results found for this basename: GLUCAP,  in the last 168 hours  No results found for this or any previous visit (from the past 240 hour(s)).   Studies: Ct Head (brain) Wo Contrast  01/06/2014   CLINICAL DATA:  Fall, left-sided weakness, difficulty ambulating  EXAM: CT HEAD WITHOUT CONTRAST  TECHNIQUE: Contiguous axial images were obtained from the base of the skull through the vertex without contrast.  COMPARISON:  05/14/2008  FINDINGS: High right parietal hyperdense hemorrhage or underlying hemorrhagic lesion noted with surrounding white matter vasogenic edema. This measures 19 x 17 mm, image 24.  No other lesions demonstrated by noncontrast CT. In the setting of trauma this could represent a hemorrhagic contusion however a hemorrhagic brain lesion/metastasis would have a similar appearance. Patient has a known history of lung cancer.  Stable brain atrophy. Minor white matter microvascular ischemic change about the lateral ventricles. No hydrocephalus, midline shift, or significant herniation. No extra-axial fluid collection. Cisterns are patent. No definite cerebellar abnormality. Orbits are symmetric. Mastoids are clear. Minor scattered sinus mucosal thickening. No skull lesion or acute fracture.   IMPRESSION: 19 mm high right parietal hemorrhagic brain lesion with surrounding vasogenic edema, favor hemorrhagic metastasis given the history of known advanced lung cancer.  These results were called by telephone at the time of interpretation on 01/06/2014 at 12:09 pm to Dr. Alfonzo Beers , who verbally acknowledged these results.   Electronically Signed   By: Daryll Brod M.D.   On: 01/06/2014 12:11    Scheduled Meds: . amLODipine  10 mg Oral Q breakfast  . carvedilol  6.25 mg Oral BID WC  . docusate sodium  100 mg Oral Daily  . furosemide  20 mg Oral QPC breakfast  . hydrochlorothiazide  25 mg Oral Q breakfast  . levETIRAcetam  500 mg Oral BID  . morphine  30 mg Oral BID  . senna  1 tablet Oral BID  . sertraline  25 mg Oral Q breakfast  . sodium chloride  3 mL Intravenous Q12H   Continuous Infusions:   Principal Problem:   Brain metastasis Active Problems:   HTN (hypertension)   Protein-calorie malnutrition, severe   Stage 4 lung cancer, right    Time spent: 35 minutes.     Niel Hummer A  [facility] Pager [phone]. If 7PM-7AM, please contact night-coverage at [email or web address], password [facility] 01/07/2014, 8:22 AM  LOS: 1 day

## 2014-01-07 NOTE — Progress Notes (Addendum)
Inpatient RN visit- [name]  [facility] 3W Room 3 -[facility] RN Visit-Karen Alford Highland RN  Related admission to [facility] diagnosis of Lung Ca.  Pt is Full Code.   Pt seen at bedside, up in recliner, wife [name] and several family members present. Pt appeared in good spirits, denied pain, slept fairly well, good appetite. Per staff report pt was a max assist for transfer to recliner, wife [name] remains concerned about his increased care needs at home. Per chart notes pt is to have a PT and OT consult, speech already consulted and pt is now on a dysphasia II diet with thin liquids.  Per neurology recommendations pt is currently receiving po decadron and Keppra as a result of head CT findings yesterday. Per chart review and patient report the plan is for patient to see the radiation oncologist after discharge to talk about pursueing further treatment. [facility] team made aware. [facility] will continue to follow daily and collaborate with Attending physician and staff regarding any symptom management or patient needs at discharge.  Patient's home medication list and transfer summary in place on shadow chart.  Please call [facility] @ [phone]-  with any hospice needs.   Thank you. Tracey Harries, RN  [facility]  Hospice Liaison  [phone])

## 2014-01-07 NOTE — Progress Notes (Signed)
Utilization review completed.  

## 2014-01-08 ENCOUNTER — Ambulatory Visit: Payer: Medicare Other | Admitting: Internal Medicine

## 2014-01-08 DIAGNOSIS — C7931 Secondary malignant neoplasm of brain: Principal | ICD-10-CM

## 2014-01-08 LAB — HEMOGLOBIN AND HEMATOCRIT, BLOOD
HEMATOCRIT: 31.4 % — AB (ref 39.0–52.0)
Hemoglobin: 10.2 g/dL — ABNORMAL LOW (ref 13.0–17.0)

## 2014-01-08 MED ORDER — DEXAMETHASONE 4 MG PO TABS: 4.0000 mg | ORAL_TABLET | Freq: Four times a day (QID) | ORAL | Status: DC

## 2014-01-08 MED ORDER — LEVETIRACETAM 500 MG PO TABS: 500.0000 mg | ORAL_TABLET | Freq: Two times a day (BID) | ORAL | Status: AC

## 2014-01-08 MED ORDER — OXYCODONE HCL 15 MG PO TABS: 15.0000 mg | ORAL_TABLET | ORAL | Status: DC | PRN

## 2014-01-08 MED ORDER — CARVEDILOL 3.125 MG PO TABS
3.1250 mg | ORAL_TABLET | Freq: Two times a day (BID) | ORAL | Status: DC
  Administered 2014-01-08 – 2014-01-09 (×2): 3.125 mg via ORAL
  Filled 2014-01-08 (×4): qty 1

## 2014-01-08 MED ORDER — PANTOPRAZOLE SODIUM 40 MG PO TBEC: 40.0000 mg | DELAYED_RELEASE_TABLET | Freq: Every day | ORAL | Status: AC

## 2014-01-08 MED ORDER — MORPHINE SULFATE 30 MG PO TABS: 30.0000 mg | ORAL_TABLET | Freq: Two times a day (BID) | ORAL | Status: DC | PRN

## 2014-01-08 MED ORDER — CARVEDILOL 3.125 MG PO TABS: 3.1250 mg | ORAL_TABLET | Freq: Two times a day (BID) | ORAL | Status: AC

## 2014-01-08 MED ORDER — MORPHINE SULFATE 30 MG PO TABS: 30.0000 mg | ORAL_TABLET | Freq: Two times a day (BID) | ORAL | Status: DC

## 2014-01-08 NOTE — Progress Notes (Signed)
RM 3W 13C   Darel Hong                                [facility] MSW Note:  Spoke with pt's dtr and spouse, who were present at the bedside. They informed that pt will be entering a SNF for rehab as soon as a bed is available. Explained that for pt's insurance to cover rehab, pt will have to revoke hospice services. Pt had spouse sign the revocation forms on his behalf. Explained that the forms will be effective once pt has been discharged to the SNF. Explained that pt can again receive services once his insurance is no longer being utilized to cover rehab. Also discussed BP for end of life care once pt is appropriate. Benedict Needy [place],LCSW

## 2014-01-08 NOTE — Progress Notes (Signed)
CSW (Clinical Education officer, museum) notified by [employer] representative that pt payer source will be traditional Medicare. CSW notified [place] and they informed CSW they will work on arranging SNF with [employer]. Awaiting return phone call to confirm transportation can be arranged.  [place], [place]

## 2014-01-08 NOTE — Progress Notes (Signed)
CSW (Clinical Education officer, museum) spoke with [employer] and was informed that the [place] is still screening pt. [place] confident they will be able to get authorization tomorrow. CSW spoke with pt and pt wife about barrier to discharge. Pt wife frustrated but understanding. Plan will be for dc tomorrow. CSW notified pt nurse who agreed to page MD.  [place], [place]

## 2014-01-08 NOTE — Discharge Summary (Signed)
Physician Discharge Summary  [name] NLG:[id number] DOB: 22-Feb-1941 DOA: 01/06/2014  PCP: [name], MD  Admit date: 01/06/2014 Discharge date: 01/08/2014  Time spent: 35 minutes  Recommendations for Outpatient Follow-up:  1. Needs to follow up with Dr Tammi Klippel for radiation treatments.  2. Needs to follow up with Dr Julien Nordmann for further care of cancer.  3.   Discharge Diagnoses:    Brain metastasis   HTN (hypertension)   Protein-calorie malnutrition, severe   Stage 4 lung cancer, right   Discharge Condition: stable.   Diet recommendation: heart healthy  Filed Weights   01/07/14 1658 01/08/14 0636  Weight: 66.361 kg (146 lb 4.8 oz) 61.598 kg (135 lb 12.8 oz)    History of present illness:  [name] is a 73 y.o. male former smoker with non small cell lung cancer- adenocarcinoma, HTN, hyperlipidemia and bronchitis. He has been noting "spasms" or "jumping" of his left leg for the past few day and he subsequently has not been able to walk. He has had numbness in both feet but more in left then right which also started 2 days ago.  Per history obtained from him and his wife, he received chemo by the [facility] starting in July/Aug of last year stopping in 12/14 due to oral abscesses. Another treatment given after treating the abscess but due to recurrent abscess this Chemo was stopped. He was becoming weaker and he was started on hospice in late Aug.  He recently saw Dr Julien Nordmann on 12/24/13 and per the note, he was thought to be a good candidate for palliative radiotherapy to the lesion on the right side of his chest and chemotherapy.  He is currently found to have a bleeding metastasis in the right frontal lobe which is hemorrhagic with surrounding vasogenic edema.    Hospital Course:  1-Brain metastasis- right parietal hemorrhagic with vasogenic edema  - avoid all anticoagulants  - evaluated by Neurosurgery and recommended Decadron and Keppra  -needs to follow up with Dr  Tammi Klippel for radiation therapy. Office will call patient for appointment.  -continue with decadron 4 mg every 6 hour. Will add protonix GI prophylaxis.   HTN (hypertension)  - cont Coreg, holder parameter, hold if HR less than 70. I have decrease the dose.  -Hold Norvasc, HCTZ SBP soft.  - also on Lasix- will continue for now   Protein-calorie malnutrition, severe  - Continue with Ensure   Stage 4 lung cancer, right  - f/u with Dr Julien Nordmann for further discussions for palliative therapy  Anemia; stable monitor.    Procedures:    Consultations:  neurosurgery  Discharge Exam: Filed Vitals:   01/08/14 0636  BP: 127/54  Pulse: 51  Temp: 98 F (36.7 C)  Resp: 26    General: Alert in no distress.  Cardiovascular: S 1, S 2 RRR Respiratory: CTA  Discharge Instructions You were cared for by a hospitalist during your hospital stay. If you have any questions about your discharge medications or the care you received while you were in the hospital after you are discharged, you can call the unit and asked to speak with the hospitalist on call if the hospitalist that took care of you is not available. Once you are discharged, your primary care physician will handle any further medical issues. Please note that NO REFILLS for any discharge medications will be authorized once you are discharged, as it is imperative that you return to your primary care physician (or establish a relationship with a  primary care physician if you do not have one) for your aftercare needs so that they can reassess your need for medications and monitor your lab values.  Discharge Instructions   Diet - low sodium heart healthy    Complete by:  As directed      Increase activity slowly    Complete by:  As directed           Current Discharge Medication List    START taking these medications   Details  dexamethasone (DECADRON) 4 MG tablet Take 1 tablet (4 mg total) by mouth every 6 (six) hours. Qty: 90  tablet, Refills: 0    levETIRAcetam (KEPPRA) 500 MG tablet Take 1 tablet (500 mg total) by mouth 2 (two) times daily. Qty: 60 tablet, Refills: 0    pantoprazole (PROTONIX) 40 MG tablet Take 1 tablet (40 mg total) by mouth daily. Qty: 30 tablet, Refills: 0      CONTINUE these medications which have CHANGED   Details  carvedilol (COREG) 3.125 MG tablet Take 1 tablet (3.125 mg total) by mouth 2 (two) times daily with a meal. Qty: 30 tablet, Refills: 0    morphine (MSIR) 30 MG tablet Take 1 tablet (30 mg total) by mouth 2 (two) times daily as needed for severe pain. Qty: 30 tablet, Refills: 0    oxyCODONE (ROXICODONE) 15 MG immediate release tablet Take 1 tablet (15 mg total) by mouth every 4 (four) hours as needed for moderate pain. Qty: 30 tablet, Refills: 0      CONTINUE these medications which have NOT CHANGED   Details  albuterol (PROVENTIL HFA;VENTOLIN HFA) 108 (90 BASE) MCG/ACT inhaler Inhale 2 puffs into the lungs every 6 (six) hours as needed for wheezing or shortness of breath. Qty: 1 Inhaler, Refills: 0    docusate sodium (COLACE) 100 MG capsule Take 100 mg by mouth daily.     furosemide (LASIX) 20 MG tablet Take 20 mg by mouth daily after breakfast.     ondansetron (ZOFRAN) 8 MG tablet Take 8 mg by mouth 2 (two) times daily as needed for nausea or vomiting.     polyethylene glycol (MIRALAX / GLYCOLAX) packet Take 17 g by mouth daily as needed for moderate constipation.     senna (SENOKOT) 8.6 MG TABS tablet Take 1 tablet by mouth 2 (two) times daily.     sertraline (ZOLOFT) 25 MG tablet Take 25 mg by mouth daily with breakfast.       STOP taking these medications     amLODipine (NORVASC) 10 MG tablet      Aspirin-Salicylamide-Caffeine (BC HEADACHE POWDER PO)      hydrochlorothiazide (HYDRODIURIL) 25 MG tablet        No Known Allergies Follow-up Information   Follow up with Tyler Pita A, MD In 1 week. (please call. )    Specialty:  Radiation Oncology    Contact information:   [address] [place] [address] [phone]        The results of significant diagnostics from this hospitalization (including imaging, microbiology, ancillary and laboratory) are listed below for reference.    Significant Diagnostic Studies: Dg Chest 2 View  12/17/2013   CLINICAL DATA:  Lung cancer, shortness of breath, flank pain, history of smoking, hypertension, coronary artery disease  EXAM: CHEST  2 VIEW  COMPARISON:  08/17/2013; correlation CT chest 08/18/2013  FINDINGS: Normal heart size and pulmonary vascularity.  Enlarged RIGHT hilum likely combination of hilar adenopathy and known perihilar mass.  Additional nodular foci more peripherally in the RIGHT middle and RIGHT lower lobes compatible with pulmonary metastases.  Mild RIGHT basilar atelectasis and small effusion.  LEFT lung grossly clear.  Probable 15 mm LEFT mid lung nodule as well.  No definite infiltrate or pneumothorax.  Progressive destruction of the lateral RIGHT third rib consistent with metastasis.  No new osseous findings.  IMPRESSION: Enlarged RIGHT hilum likely combination of hilar adenopathy and known perihilar mass.  BILATERAL pulmonary metastases.  Increased RIGHT basilar pleural effusion and atelectasis.  Destructive lytic lesion involving the RIGHT third rib.   Electronically Signed   By: Lavonia Dana M.D.   On: 12/17/2013 10:48   Ct Head (brain) Wo Contrast  01/06/2014   CLINICAL DATA:  Fall, left-sided weakness, difficulty ambulating  EXAM: CT HEAD WITHOUT CONTRAST  TECHNIQUE: Contiguous axial images were obtained from the base of the skull through the vertex without contrast.  COMPARISON:  05/14/2008  FINDINGS: High right parietal hyperdense hemorrhage or underlying hemorrhagic lesion noted with surrounding white matter vasogenic edema. This measures 19 x 17 mm, image 24.  No other lesions demonstrated by noncontrast CT. In the setting of trauma this could represent a hemorrhagic  contusion however a hemorrhagic brain lesion/metastasis would have a similar appearance. Patient has a known history of lung cancer.  Stable brain atrophy. Minor white matter microvascular ischemic change about the lateral ventricles. No hydrocephalus, midline shift, or significant herniation. No extra-axial fluid collection. Cisterns are patent. No definite cerebellar abnormality. Orbits are symmetric. Mastoids are clear. Minor scattered sinus mucosal thickening. No skull lesion or acute fracture.  IMPRESSION: 19 mm high right parietal hemorrhagic brain lesion with surrounding vasogenic edema, favor hemorrhagic metastasis given the history of known advanced lung cancer.  These results were called by telephone at the time of interpretation on 01/06/2014 at 12:09 pm to Dr. Alfonzo Beers , who verbally acknowledged these results.   Electronically Signed   By: Daryll Brod M.D.   On: 01/06/2014 12:11   Ct Chest W Contrast  12/31/2013   CLINICAL DATA:  Lung cancer, chemotherapy complete. Right sided chest pain with shortness of breath and constipation.  EXAM: CT CHEST, ABDOMEN, AND PELVIS WITH CONTRAST  TECHNIQUE: Multidetector CT imaging of the chest, abdomen and pelvis was performed following the standard protocol during bolus administration of intravenous contrast.  CONTRAST:  165mL OMNIPAQUE IOHEXOL 300 MG/ML  SOLN  COMPARISON:  CT chest 08/18/2013.  FINDINGS: CT CHEST FINDINGS  Aright internal jugular/ high right paratracheal lymph node measures 12 mm (previously 8 mm). Mediastinal adenopathy measures up to 12 mm in the AP window (previously 8 mm). Right hilar mass measures approximately 9.2 x 9.6 cm (previously 6.0 x 7.9 cm when remeasured). No axillary adenopathy. Heart size normal. No pericardial effusion.  Moderate amount of loculated right pleural fluid, increased from the prior exam. Pleural nodules have increased in number and size in the right hemi thorax. Index nodule in the anteromedial right hemi  thorax measures 2.5 cm (previously 1.3 cm). Centrilobular emphysema. No left pleural fluid. Mild narrowing of the bronchus intermedius. Airway is otherwise unremarkable.  CT ABDOMEN AND PELVIS FINDINGS  Hepatobiliary: An intermediate density lesion in the inferior left hepatic lobe measures 9 mm and 46 Hounsfield units (series 2, image 75). Additional tiny low-attenuation lesions in the liver measure up to 3 mm, too small to characterize. Gallbladder is unremarkable. No biliary ductal dilatation.  Adrenals/urinary tract: Adrenal glands are unremarkable. Low-attenuation lesions in the kidneys measure up to  2.4 cm on the right and are likely cysts. Ureters are decompressed. Bladder is unremarkable.  Spleen: Negative.  Pancreas: Negative.  Stomach/Bowel: Stomach is unremarkable. Mild scattered gaseous prominence of small bowel, nonspecific. Small bowel, colon, small bowel mesentery and omentum are otherwise unremarkable.  Reproductive: Prostate is enlarged.  Vascular/Lymphatic: Atherosclerotic calcification of the arterial vasculature without abdominal aortic aneurysm. No pathologically enlarged lymph nodes.  Other: Small pelvic free fluid.  Musculoskeletal: Degenerative changes are seen in the hips and spine. Lytic destruction is seen in the posterolateral right third rib. A small lytic lesion is also seen in the right ninth posterior lateral rib. A small focal lucency in the medial aspect of the left iliac wing (series 2, image 90) is nonspecific.  IMPRESSION: 1. Interval progression in stage IV lung cancer, as evidenced by an enlarging right hilar mass as well as worsening intrathoracic adenopathy and right pleural implants/effusion. 2. Right rib metastases, as before. 3. Intermediate density lesion in the inferior left hepatic lobe is indeterminate. Metastatic disease is not excluded. 4. Small pelvic free fluid. 5. Prostate enlargement.   Electronically Signed   By: Lorin Picket M.D.   On: 12/31/2013 13:27    Ct Abdomen Pelvis W Contrast  12/31/2013   CLINICAL DATA:  Lung cancer, chemotherapy complete. Right sided chest pain with shortness of breath and constipation.  EXAM: CT CHEST, ABDOMEN, AND PELVIS WITH CONTRAST  TECHNIQUE: Multidetector CT imaging of the chest, abdomen and pelvis was performed following the standard protocol during bolus administration of intravenous contrast.  CONTRAST:  147mL OMNIPAQUE IOHEXOL 300 MG/ML  SOLN  COMPARISON:  CT chest 08/18/2013.  FINDINGS: CT CHEST FINDINGS  Aright internal jugular/ high right paratracheal lymph node measures 12 mm (previously 8 mm). Mediastinal adenopathy measures up to 12 mm in the AP window (previously 8 mm). Right hilar mass measures approximately 9.2 x 9.6 cm (previously 6.0 x 7.9 cm when remeasured). No axillary adenopathy. Heart size normal. No pericardial effusion.  Moderate amount of loculated right pleural fluid, increased from the prior exam. Pleural nodules have increased in number and size in the right hemi thorax. Index nodule in the anteromedial right hemi thorax measures 2.5 cm (previously 1.3 cm). Centrilobular emphysema. No left pleural fluid. Mild narrowing of the bronchus intermedius. Airway is otherwise unremarkable.  CT ABDOMEN AND PELVIS FINDINGS  Hepatobiliary: An intermediate density lesion in the inferior left hepatic lobe measures 9 mm and 46 Hounsfield units (series 2, image 75). Additional tiny low-attenuation lesions in the liver measure up to 3 mm, too small to characterize. Gallbladder is unremarkable. No biliary ductal dilatation.  Adrenals/urinary tract: Adrenal glands are unremarkable. Low-attenuation lesions in the kidneys measure up to 2.4 cm on the right and are likely cysts. Ureters are decompressed. Bladder is unremarkable.  Spleen: Negative.  Pancreas: Negative.  Stomach/Bowel: Stomach is unremarkable. Mild scattered gaseous prominence of small bowel, nonspecific. Small bowel, colon, small bowel mesentery and omentum  are otherwise unremarkable.  Reproductive: Prostate is enlarged.  Vascular/Lymphatic: Atherosclerotic calcification of the arterial vasculature without abdominal aortic aneurysm. No pathologically enlarged lymph nodes.  Other: Small pelvic free fluid.  Musculoskeletal: Degenerative changes are seen in the hips and spine. Lytic destruction is seen in the posterolateral right third rib. A small lytic lesion is also seen in the right ninth posterior lateral rib. A small focal lucency in the medial aspect of the left iliac wing (series 2, image 90) is nonspecific.  IMPRESSION: 1. Interval progression in stage IV lung cancer, as  evidenced by an enlarging right hilar mass as well as worsening intrathoracic adenopathy and right pleural implants/effusion. 2. Right rib metastases, as before. 3. Intermediate density lesion in the inferior left hepatic lobe is indeterminate. Metastatic disease is not excluded. 4. Small pelvic free fluid. 5. Prostate enlargement.   Electronically Signed   By: Lorin Picket M.D.   On: 12/31/2013 13:27    Microbiology: No results found for this or any previous visit (from the past 240 hour(s)).   Labs: Basic Metabolic Panel:  Recent Labs Lab 01/02/14 0824 01/06/14 1110 01/06/14 1123 01/07/14 0530  NA 141 138 139 136*  K 3.5 3.7 3.6* 3.9  CL  --  97 98 96  CO2 34* 30  --  27  GLUCOSE 120 96 100* 149*  BUN 13.2 12 11 21   CREATININE 1.2 1.03 1.00 1.07  CALCIUM 9.3 8.9  --  9.2   Liver Function Tests:  Recent Labs Lab 01/02/14 0824 01/06/14 1110  AST 20 19  ALT 6 8  ALKPHOS 64 60  BILITOT 0.32 0.3  PROT 8.8* 8.6*  ALBUMIN 2.1* 2.2*   No results found for this basename: LIPASE, AMYLASE,  in the last 168 hours No results found for this basename: AMMONIA,  in the last 168 hours CBC:  Recent Labs Lab 01/02/14 0824 01/06/14 1110 01/06/14 1123 01/07/14 0530  WBC 5.8 5.7  --  4.2  NEUTROABS 3.8 3.7  --   --   HGB 7.3* 8.9* 10.2* 8.7*  HCT 23.6* 27.9* 30.0*  27.1*  MCV 71.2* 76.0*  --  73.0*  PLT 391 333  --  367   Cardiac Enzymes: No results found for this basename: CKTOTAL, CKMB, CKMBINDEX, TROPONINI,  in the last 168 hours BNP: BNP (last 3 results)  Recent Labs  05/16/13 2228  PROBNP 88.2   CBG: No results found for this basename: GLUCAP,  in the last 168 hours     Signed:  Niel Hummer A  [facility] 01/08/2014, 10:23 AM

## 2014-01-08 NOTE — Progress Notes (Signed)
CSW (Clinical Education officer, museum) awaiting phone call from [facility] representative to confirm payer source for pt. Pt hospice revoked today, first of the month, so insurance representative is unsure if Medicare will be payer source or [employer]. If Medicare is pt payer source, pt does not have a qualifying stay for SNF coverage. However, [facility] admissions confirmed they would be able to accept pt using [employer] benefit. [place] confirmed that they would make arrangements with the [place] in house and pt would be able to dc today. CSW has discussed this with pt and pt wife. They are agreeable to wait until 3:45pm for response from insurance. If [facility] Medicare will not be the payer source or if no response is received, pt and pt husband are agreeable to plan for dc to [facility] today. However, pt wife was clear in stating that if possible they would prefer for dc to [facility].  [place], [place]

## 2014-01-08 NOTE — Progress Notes (Addendum)
  Related Admission to [facility] Dx: Lung Cancer; pt is a Full Code. Pt seen in room, lying in bed, awake, alert oriented to person, place, time, appeared in no distress; O2 @ 2LNC in place; no family present.  Pt talkative during visit, though sometimes kept his eyes closed while talking; he shared many family/church friends visited earlier and talked briefly about the effort it took to get up and transfer from the chair back to bed at the end of the day.  - pt also stated "they say I'm going to need a lot more help because my left side isn't working" and when asked what his understanding he stated: 'it seems I had a stroke...that my cancer is in my brain.Marland KitchenMarland KitchenI will be seeing Dr Julien Nordmann to talk about options...they have mentioned some kind of radiation" .  Writer discussed with patient that [facility] team will follow up with primary hospital team, as well as pt and family, tomorrow to collaborate on needs at discharge and pt voiced appreciation.  Per chart review/OT note and discussion with staff RN Meredith Mody, pt with L hemiplegia, L lateral lean, now requires maximum assist with most ADLs and today dropped his O2 sat into the 80s with exertion from chair to bed- all of which is a significant change in functional status for pt; per staff RN Meredith Mody, pt's wife was not present during staff attempt to transfer pt back to bed and staff uncertain how much awareness wife/family has about current increased physical care needs and functional deficits; staff RN Meredith Mody shared that OT note indicated request for 'an increase Hospice Care Benefits'. Writer briefly discussed [facility] services with staff RN and shared that [facility] Team plans to follow up tomorrow to speak with the hospital team, as well as the pt/ wife and are available to help address concerns, questions regarding discharge planning needs.  Mariel Kansky [facility] RN Clinical Manager was present and observed visit. Late entry [facility] RN visit- pt seen 01/07/14  4:30-5:50 pm)   Danton Sewer, RN MSN [facility] Liaison [phone]

## 2014-01-08 NOTE — Evaluation (Signed)
Physical Therapy Evaluation Patient Details Name: [name] MRN: [id number] DOB: [birth year] Today's Date: 01/08/2014   History of Present Illness  73 yo male admitted with LT leg jumping and spasms with several days unable to walk. Pt with rt parietal hemorrhagic with vasogenic edema. Lt hemiparesis  CT(+) LT frontal hemorrhagic lesions ~1.5 cm PMH: lung CA stage IV and liver , HTN, glaucoma, CAD,   Clinical Impression  Pt very pleasant with decreased awareness, balance and function. Pt with significant balance and safety deficits requiring mod-max assist for all mobility with 2 person assist for safety secondary to lean and dysmetric LUE. Wife can only provide supervision to at most min assist due to her own CA recovery and concerns without additional family support. Wife expressed great concern over her financial and physical burdens with spouse and family, has not shared her latest medical news with them and do not feel she can manage pt with mobility at this point. Pt would benefit from ST-SNF as well as assist for medical management in SNF at this time. Wife and Dr.Regalado present and agreeable to this plan. Will follow acutely to maximize mobility, function, gait, strength and balance to decrease burden of care and maximize quality of life.     Follow Up Recommendations SNF;Supervision/Assistance - 24 hour    Equipment Recommendations  None recommended by PT    Recommendations for Other Services       Precautions / Restrictions Precautions Precautions: Fall Precaution Comments: posterior left lean, dysmetric LUE, inattention left      Mobility  Bed Mobility Overal bed mobility: Needs Assistance;+2 for physical assistance;+ 2 for safety/equipment Bed Mobility: Sidelying to Sit   Sidelying to sit: Max assist       General bed mobility comments: pt with feet against foot board, unaware. Max cues and assist to bring legs off bed and elevate trunk from surface. Significant  posterior left lean in siting with assist to maintain balance and physical placement of LUE throughout to control positioning  Transfers Overall transfer level: Needs assistance   Transfers: Sit to/from Stand;Stand Pivot Transfers Sit to Stand: Mod assist;+2 safety/equipment Stand pivot transfers: Mod assist;+2 safety/equipment       General transfer comment: cues for hand placement, physical assist to rotate pelvis and control LUE with pivot chair to [facility].   Ambulation/Gait Ambulation/Gait assistance: Max assist;+2 safety/equipment Ambulation Distance (Feet): 10 Feet Assistive device: Rolling walker (2 wheeled) Gait Pattern/deviations: Step-to pattern;Leaning posteriorly;Staggering left;Narrow base of support;Shuffle;Decreased weight shift to right   Gait velocity interpretation: Below normal speed for age/gender General Gait Details: Pt with heavy left lean relying on therapist throughout for balance and safety with narrow BOS, lack of control of walker with hand held assist to maintain grasp on RW with Left hand. Max multimodal cues with chair following  Stairs            Wheelchair Mobility    Modified Rankin (Stroke Patients Only) Modified Rankin (Stroke Patients Only) Pre-Morbid Rankin Score: Moderate disability Modified Rankin: Severe disability     Balance Overall balance assessment: Needs assistance   Sitting balance-Leahy Scale: Zero       Standing balance-Leahy Scale: Zero                               Pertinent Vitals/Pain Pain Assessment: No/denies pain    Home Living Family/patient expects to be discharged to:: Private residence Living Arrangements: Spouse/significant other Available Help  at Discharge: Family Type of Home: House Home Access: Stairs to enter   [employer] of Steps: 10-12 Home Layout: Two level Home Equipment: Environmental consultant - 2 wheels;Cane - single point;Tub bench;Grab bars - toilet;Grab bars - tub/shower;Hand  held shower head;Wheelchair - manual Additional Comments: wife available 24/7 however currently with  r/o cervical cancer and hx of x3 CA . Wife able to provide min (A) at this time    Prior Function Level of Independence: Needs assistance      ADL's / Homemaking Assistance Needed: hospice aid and rn coming x2 days a week to help with adls.         Hand Dominance        Extremity/Trunk Assessment   Upper Extremity Assessment: Defer to OT evaluation           Lower Extremity Assessment: Generalized weakness;Difficult to assess due to impaired cognition      Cervical / Trunk Assessment: Normal  Communication   Communication: No difficulties  Cognition Arousal/Alertness: Awake/alert Behavior During Therapy: Flat affect Overall Cognitive Status: Impaired/Different from baseline Area of Impairment: Memory;Following commands;Safety/judgement;Awareness;Problem solving     Memory: Decreased short-term memory Following Commands: Follows one step commands with increased time;Follows one step commands inconsistently Safety/Judgement: Decreased awareness of safety;Decreased awareness of deficits   Problem Solving: Slow processing;Decreased initiation;Difficulty sequencing;Requires verbal cues;Requires tactile cues General Comments: Pt and wife report increased balance deficits with significant posterior left lean, lack of control of LUE and pt unaware and unable to process sequence of transfer without max multimodal cues and assist    General Comments      Exercises        Assessment/Plan    PT Assessment Patient needs continued PT services  PT Diagnosis Abnormality of gait;Generalized weakness;Altered mental status   PT Problem List Decreased strength;Decreased cognition;Decreased activity tolerance;Decreased safety awareness;Decreased balance;Decreased skin integrity;Decreased knowledge of precautions;Decreased mobility;Decreased knowledge of use of DME  PT Treatment  Interventions Gait training;DME instruction;Balance training;Neuromuscular re-education;Functional mobility training;Therapeutic activities;Patient/family education   PT Goals (Current goals can be found in the Care Plan section) Acute Rehab PT Goals Patient Stated Goal: to be able to walk PT Goal Formulation: With patient/family Time For Goal Achievement: 01/22/14 Potential to Achieve Goals: Fair    Frequency Min 3X/week   Barriers to discharge Decreased caregiver support      Co-evaluation               End of Session Equipment Utilized During Treatment: Gait belt Activity Tolerance: Patient tolerated treatment well Patient left: in chair;with call bell/phone within reach;with chair alarm set;with family/visitor present Nurse Communication: Mobility status;Precautions         Time: 4827-0786 PT Time Calculation (min): 31 min   Charges:     PT Treatments $Gait Training: 8-22 mins $Therapeutic Activity: 8-22 mins   PT G Codes:          Melford Aase 01/08/2014, 9:14 AM Elwyn Reach, [place]

## 2014-01-08 NOTE — Progress Notes (Signed)
Inpatient RN visit- Kazim Corrales [facility] 3W Room 13 -[facility] RN Visit-Karen Alford Highland RN  Related admission to [facility] diagnosis of Lung Ca.  Pt is FULL Code.   Pt seen at bedside, up in the recliner, alert and oriented x 4.  Denied pain or discomfort, remains on O2 at 2L continuous. Wife Delcie Roch present for visit. Discussed patient's increased care needs, results of both PT and OT consults, Delcie Roch again stated her inability to provide care for [facility] at home. Currently he needs max for transfers and is not ambulatory. Patient able to voice current plan of care which is for him to discharge to a STR  for therapy. He also voiced his desire to pursue discussions of other treatment options if offered.  Writer confirmed above plan with hospital staff.  [facility] team made aware of discharge plans. Pt and his wife aware that he will be revoking the Hospice benefit to pursue STR. [facility] SW to follow up with family and hospital team. [facility] will continue to follow through discharge.  Patient's home medication list and transfer summary in place  on shadow chart.  Please call [facility] @ [phone] with any hospice needs.   Thank you. Tracey Harries, RN  [facility]  Hospice Liaison  [phone])

## 2014-01-08 NOTE — Progress Notes (Addendum)
Clinical Social Work Department CLINICAL SOCIAL WORK PLACEMENT NOTE 01/08/2014  Patient:  [name], [name]  Account Number:  [id number] Admit date:  01/06/2014  Clinical Social Worker:  Berton Mount, Latanya Presser  Date/time:  01/08/2014 12:00 N  Clinical Social Work is seeking post-discharge placement for this patient at the following level of care:   SKILLED NURSING   (*CSW will update this form in Epic as items are completed)   01/08/2014  Patient/family provided with [place] Department of Clinical Social Work's list of facilities offering this level of care within the geographic area requested by the patient (or if unable, by the patient's family).  01/08/2014  Patient/family informed of their freedom to choose among providers that offer the needed level of care, that participate in Medicare, Medicaid or managed care program needed by the patient, have an available bed and are willing to accept the patient.  01/08/2014  Patient/family informed of [facility]' ownership interest in [facility], as well as of the fact that they are under no obligation to receive care at this facility.  PASARR submitted to EDS on 01/08/2014 PASARR number received on 01/08/2014  FL2 transmitted to all facilities in geographic area requested by pt/family on  01/08/2014 FL2 transmitted to all facilities within larger geographic area on   Patient informed that his/her managed care company has contracts with or will negotiate with  certain facilities, including the following:     Patient/family informed of bed offers received:  01/08/2014 Patient chooses bed at [facility] Physician recommends and patient chooses bed at    Patient to be transferred to [place] on  01/09/2014 Patient to be transferred to facility by [facility] Patient and family notified of transfer on Mehdi Gironda Name of family member notified:  01/09/2014  The following physician request were entered  in Epic: Physician Request  Please sign FL2.    Additional CommentsBerton Mount, [place]

## 2014-01-08 NOTE — Progress Notes (Signed)
CSW (Clinical Education officer, museum) called hospice social worker to request documentation required by insurance to be completed as soon as possible. CSW left voicemail for social worker and awaiting call back.  [place], [place]

## 2014-01-08 NOTE — Progress Notes (Signed)
OT Cancellation Note  Patient Details Name: [name] MRN: [id number] DOB: 1941-03-11   Cancelled Treatment:      Plan now is for pt to D/C to SNF. OT signing off. Will defer further OT to SNF as appropriate.   [place], OTR/L  [phone] 01/08/2014 01/08/2014, 11:51 AM

## 2014-01-08 NOTE — Progress Notes (Signed)
Clinical Social Work Department BRIEF PSYCHOSOCIAL ASSESSMENT 01/08/2014  Patient:  [name]     Account Number:  [id number]     Admit date:  01/06/2014  Clinical Social Worker:  Adair Laundry  Date/Time:  01/08/2014 11:00 AM  Referred by:  Physician  Date Referred:  01/08/2014 Referred for  SNF Placement   Other Referral:   Interview type:  Patient Other interview type:   Spoke with pt, pt wife, and pt daughter at bedside    PSYCHOSOCIAL DATA Living Status:  WIFE Admitted from facility:   Level of care:   Primary support name:  Elison Worrel Primary support relationship to patient:  SPOUSE Degree of support available:   Pt has strong family and community support    CURRENT CONCERNS Current Concerns  Post-Acute Placement   Other Concerns:    SOCIAL WORK ASSESSMENT / PLAN CSW aware of therapy recommendations for SNF. CSW visited pt room and spoke with pt, pt wife, and pt daughter at bedside. Pt wife expressed concerns to CSW about being able to continue to manage pt care at home. Pt wife stated that she fears pt will never be able to return home based off of how he has been this morning. CSW offered support to pt and pt family. CSW explained SNF referral process and that CSW has already checked with [facility] after nursing informed CSW that was family prefence. [facility] is unsure if they will have a bed available. Pt family informed CSW there second preference would be for [facility]. Pt and pt family agreeable to referral being sent to all [place] SNFs. CSW also faxed clinicals to insurance for authorization.   Assessment/plan status:  Psychosocial Support/Ongoing Assessment of Needs Other assessment/ plan:   Information/referral to community resources:   SNF list to be provided with bed offers    PATIENT'S/FAMILY'S RESPONSE TO PLAN OF CARE: Pt and pt family are agreeable to SNF       [place], [place]

## 2014-01-08 NOTE — Progress Notes (Signed)
Chaplain responded to page from nurse that family was in need of spiritual support. Chaplain had private conversation with pt's wife. Wife was feeling overwhelmed and emotionally exhausted. Her support system is limited. Chaplain offered empathic listening, encouraged self-care, and explored emotional and relational needs and resources.    01/08/14 1027  Clinical Encounter Type  Visited With Family  Visit Type Spiritual support  Referral From Nurse  Consult/Referral To Chaplain  Recommendations Consult with social work  Spiritual Encounters  Spiritual Needs Emotional  Stress Factors  Family Stress Factors Exhausted;Financial concerns    [name] 01/08/2014 10:32 AM

## 2014-01-08 NOTE — Progress Notes (Signed)
Speech Language Pathology Treatment: Dysphagia  Patient Details Name: [name] MRN: [id number] DOB: 1941-02-05 Today's Date: 01/08/2014 Time: 3343-5686 SLP Time Calculation (min): 12 min  Assessment / Plan / Recommendation Clinical Impression  Pt sitting in chair today, leaning right, with spouse in room.  Pt appeared sleepy today but participative.  Observed pt consuming chopped peaches and apple juice.  "Mastication" was functional but delayed due to edentulous status.  Pt also tends to talk before swallowing food.   No clinical indications of aspiration with po intake.  Spouse and pt agree that swallow ability is consistent with prior to this medical event and pt was on chopped diet prior to admission.  Educated spouse/pt to diet recommendations and aspiration precautions using teach back for assurance of understanding.  No further SLP indicated as pt is tolerating baseline diet.     HPI HPI: [name] is a 73 y.o. male former smoker with non small cell lung cancer- adenocarcinoma, HTN, hyperlipidemia and bronchitis hx. He reported to MD noting "spasms" or "jumping" of his left leg for the past few day and he subsequently has not been able to walk. He has had numbness in both feet but more in left then right.  Per MD note, pt he received chemo by the [facility] starting in July/Aug of last year stopping in 12/14 due to oral abscesses with reattempt after oral treatement with recurrence of oral abscesses. Per MD Note, pt has been becoming weaker and he was started on hospice in late Aug.  BSE ordered.  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     Pertinent Vitals Pain Assessment: No/denies pain  SLP Plan  All goals met    Recommendations Diet recommendations: Dysphagia 2 (fine chop);Thin liquid Liquids provided via: Cup;Straw Medication Administration: Whole meds with liquid Supervision: Patient able to self feed Compensations: Slow  rate;Small sips/bites;Check for pocketing Postural Changes and/or Swallow Maneuvers: Seated upright 90 degrees;Upright 30-60 min after meal              Oral Care Recommendations: Oral care BID Follow up Recommendations: None Plan: All goals met    [place], [place] [facility] SLP [phone]

## 2014-01-09 ENCOUNTER — Inpatient Hospital Stay (HOSPITAL_COMMUNITY): Payer: Non-veteran care

## 2014-01-09 ENCOUNTER — Other Ambulatory Visit: Payer: Self-pay | Admitting: Radiation Therapy

## 2014-01-09 DIAGNOSIS — I10 Essential (primary) hypertension: Secondary | ICD-10-CM

## 2014-01-09 DIAGNOSIS — C7949 Secondary malignant neoplasm of other parts of nervous system: Principal | ICD-10-CM

## 2014-01-09 DIAGNOSIS — C7931 Secondary malignant neoplasm of brain: Secondary | ICD-10-CM

## 2014-01-09 LAB — URINALYSIS, ROUTINE W REFLEX MICROSCOPIC
BILIRUBIN URINE: NEGATIVE
GLUCOSE, UA: NEGATIVE mg/dL
Hgb urine dipstick: NEGATIVE
Ketones, ur: NEGATIVE mg/dL
LEUKOCYTES UA: NEGATIVE
NITRITE: NEGATIVE
PH: 7 (ref 5.0–8.0)
Protein, ur: NEGATIVE mg/dL
SPECIFIC GRAVITY, URINE: 1.011 (ref 1.005–1.030)
Urobilinogen, UA: 0.2 mg/dL (ref 0.0–1.0)

## 2014-01-09 MED ORDER — DEXAMETHASONE SODIUM PHOSPHATE 10 MG/ML IJ SOLN
10.0000 mg | Freq: Once | INTRAMUSCULAR | Status: AC
  Administered 2014-01-09: 10 mg via INTRAVENOUS
  Filled 2014-01-09: qty 1

## 2014-01-09 MED ORDER — MORPHINE SULFATE 30 MG PO TABS: 30.0000 mg | ORAL_TABLET | Freq: Two times a day (BID) | ORAL | Status: DC

## 2014-01-09 NOTE — Progress Notes (Signed)
Pt discharged to [facility] per MD order.  No family at bedside to review discharge instructions at time of discharge, wife aware and agreeable to discharge plan. Report given to Blanch Media, receiving RN at [facility]. All belongings sent with pt. [facility]. Lenna Sciara

## 2014-01-09 NOTE — Progress Notes (Signed)
Inpatient RN visit-Karry Staff [facility] 3W  Room 3  -[facility] RN Visit-Karen Alford Highland RN  Related admission to [facility] diagnosis of Lung Ca.  Pt is FULL  code.  Prior to visit with patient writer spoke with staff RN Raquel Sarna, [facility] Betsy Pries and Attending physician Dr. Tyrell Antonio. Raquel Sarna had alerted MD regarding pt's c/o head ache and increased confusion. Patient had received PRN medication and also a repeat head CT scan earlier. CT scan showed no changes per report. Dr. Tyrell Antonio spoke with patient and his wife, who voiced the desire to follow through with the current plan for discharge to SNF for STR. Patient seen at bedside, wife Delcie Roch present. Pt sitting up in bed, leaning to the right, reported he was "comfortable " that way, declined repositioning, denied pain. Patient stated he was "ready to go" and motioned to the telemetry box and asked if writer could remove it.  CSW [place] in at end of visit to advise patient and his wife that transport had been arranged. Pt will be revoking hospice services to pursue STR at [facility].  Patient's home medication list and transfer summary in place on shadow chart.  Please call [facility] @ [phone]-with any hospice needs.   Thank you. Tracey Harries, RN  [facility]  Hospice Liaison  [phone])

## 2014-01-09 NOTE — Progress Notes (Signed)
Medicare Important Message given?  YES (If response is "NO", the following Medicare IM given date fields will be blank) Date Medicare IM given:  01/09/2014 Medicare IM given by:  [facility]

## 2014-01-09 NOTE — Progress Notes (Signed)
CSW Armed forces technical officer) spoke with pt wife and plan will be for dc to [facility] today. CSW prepared pt dc packet and placed with shadow chart. CSW arranged non-emergent ambulance transport. Pt, pt family, pt nurse, and facility informed. CSW signing off.  [place], [place]

## 2014-01-09 NOTE — Discharge Summary (Addendum)
Physician Discharge Summary  [name] YHC:[id number] DOB: 1941/03/26 DOA: 01/06/2014  PCP: [name], MD  Admit date: 01/06/2014 Discharge date: 01/09/2014  Time spent: 35 minutes  Recommendations for Outpatient Follow-up:  1. Needs to follow up with Dr Tammi Klippel for radiation treatments.  2. Needs to follow up with Dr Julien Nordmann for further care of cancer.  3.   Discharge Diagnoses:    Brain metastasis   HTN (hypertension)   Protein-calorie malnutrition, severe   Stage 4 lung cancer, right   Discharge Condition: stable.   Diet recommendation: heart healthy  Filed Weights   01/07/14 1658 01/08/14 0636 01/09/14 0324  Weight: 66.361 kg (146 lb 4.8 oz) 61.598 kg (135 lb 12.8 oz) 57.425 kg (126 lb 9.6 oz)    History of present illness:  [name] is a 73 y.o. male former smoker with non small cell lung cancer- adenocarcinoma, HTN, hyperlipidemia and bronchitis. He has been noting "spasms" or "jumping" of his left leg for the past few day and he subsequently has not been able to walk. He has had numbness in both feet but more in left then right which also started 2 days ago.  Per history obtained from him and his wife, he received chemo by the [facility] starting in July/Aug of last year stopping in 12/14 due to oral abscesses. Another treatment given after treating the abscess but due to recurrent abscess this Chemo was stopped. He was becoming weaker and he was started on hospice in late Aug.  He recently saw Dr Julien Nordmann on 12/24/13 and per the note, he was thought to be a good candidate for palliative radiotherapy to the lesion on the right side of his chest and chemotherapy.  He is currently found to have a bleeding metastasis in the right frontal lobe which is hemorrhagic with surrounding vasogenic edema.    Hospital Course:  1-Brain metastasis- right parietal hemorrhagic with vasogenic edema  - avoid all anticoagulants  - evaluated by Neurosurgery and recommended Decadron  and Keppra  -needs to follow up with Dr Tammi Klippel for radiation therapy. Office will call patient for appointment.  -continue with decadron 4 mg every 6 hour. Will add protonix GI prophylaxis.  -complaining of headaches. Repeated CT head stable, review with radiologist. Will try IV decadron one time. Continue with oral decadron.  -Patient has been confuse, probably multifactorial, brain metastasis and decadron.  -if feeling better will plan to discharge later today,.  -Patient feels that the headaches is from stress. He wants to go to SNF.  -wife is ok with discharge plan.  -Needs to follow up with radiation oncologist soon.   HTN (hypertension)  - cont Coreg, holder parameter, hold if HR less than 70. I have decrease the dose.  -Hold Norvasc, HCTZ SBP soft.  - also on Lasix- will continue for now  -If BP continue to increase please resume Norvasc.   Protein-calorie malnutrition, severe  - Continue with Ensure   Stage 4 lung cancer, right  - f/u with Dr Julien Nordmann for further discussions for palliative therapy  Anemia; stable monitor.    Procedures:    Consultations:  neurosurgery  Discharge Exam: Filed Vitals:   01/09/14 1034  BP: 148/67  Pulse: 55  Temp: 98.7 F (37.1 C)  Resp: 18    General: Alert in no distress.  Cardiovascular: S 1, S 2 RRR Respiratory: CTA  Discharge Instructions You were cared for by a hospitalist during your hospital stay. If you have any questions about your  discharge medications or the care you received while you were in the hospital after you are discharged, you can call the unit and asked to speak with the hospitalist on call if the hospitalist that took care of you is not available. Once you are discharged, your primary care physician will handle any further medical issues. Please note that NO REFILLS for any discharge medications will be authorized once you are discharged, as it is imperative that you return to your primary care physician (or  establish a relationship with a primary care physician if you do not have one) for your aftercare needs so that they can reassess your need for medications and monitor your lab values.  Discharge Instructions   Diet - low sodium heart healthy    Complete by:  As directed      Increase activity slowly    Complete by:  As directed           Current Discharge Medication List    START taking these medications   Details  dexamethasone (DECADRON) 4 MG tablet Take 1 tablet (4 mg total) by mouth every 6 (six) hours. Qty: 90 tablet, Refills: 0    levETIRAcetam (KEPPRA) 500 MG tablet Take 1 tablet (500 mg total) by mouth 2 (two) times daily. Qty: 60 tablet, Refills: 0    pantoprazole (PROTONIX) 40 MG tablet Take 1 tablet (40 mg total) by mouth daily. Qty: 30 tablet, Refills: 0      CONTINUE these medications which have CHANGED   Details  carvedilol (COREG) 3.125 MG tablet Take 1 tablet (3.125 mg total) by mouth 2 (two) times daily with a meal. Qty: 30 tablet, Refills: 0    morphine (MSIR) 30 MG tablet Take 1 tablet (30 mg total) by mouth 2 (two) times daily as needed for severe pain. Qty: 30 tablet, Refills: 0    oxyCODONE (ROXICODONE) 15 MG immediate release tablet Take 1 tablet (15 mg total) by mouth every 4 (four) hours as needed for moderate pain. Qty: 30 tablet, Refills: 0      CONTINUE these medications which have NOT CHANGED   Details  albuterol (PROVENTIL HFA;VENTOLIN HFA) 108 (90 BASE) MCG/ACT inhaler Inhale 2 puffs into the lungs every 6 (six) hours as needed for wheezing or shortness of breath. Qty: 1 Inhaler, Refills: 0    docusate sodium (COLACE) 100 MG capsule Take 100 mg by mouth daily.     furosemide (LASIX) 20 MG tablet Take 20 mg by mouth daily after breakfast.     ondansetron (ZOFRAN) 8 MG tablet Take 8 mg by mouth 2 (two) times daily as needed for nausea or vomiting.     polyethylene glycol (MIRALAX / GLYCOLAX) packet Take 17 g by mouth daily as needed for  moderate constipation.     senna (SENOKOT) 8.6 MG TABS tablet Take 1 tablet by mouth 2 (two) times daily.     sertraline (ZOLOFT) 25 MG tablet Take 25 mg by mouth daily with breakfast.       STOP taking these medications     amLODipine (NORVASC) 10 MG tablet      Aspirin-Salicylamide-Caffeine (BC HEADACHE POWDER PO)      hydrochlorothiazide (HYDRODIURIL) 25 MG tablet        No Known Allergies Follow-up Information   Follow up with Tyler Pita A, MD In 1 week. (please call. )    Specialty:  Radiation Oncology   Contact information:   [address] [place] [address] [phone]  The results of significant diagnostics from this hospitalization (including imaging, microbiology, ancillary and laboratory) are listed below for reference.    Significant Diagnostic Studies: Dg Chest 2 View  12/17/2013   CLINICAL DATA:  Lung cancer, shortness of breath, flank pain, history of smoking, hypertension, coronary artery disease  EXAM: CHEST  2 VIEW  COMPARISON:  08/17/2013; correlation CT chest 08/18/2013  FINDINGS: Normal heart size and pulmonary vascularity.  Enlarged RIGHT hilum likely combination of hilar adenopathy and known perihilar mass.  Additional nodular foci more peripherally in the RIGHT middle and RIGHT lower lobes compatible with pulmonary metastases.  Mild RIGHT basilar atelectasis and small effusion.  LEFT lung grossly clear.  Probable 15 mm LEFT mid lung nodule as well.  No definite infiltrate or pneumothorax.  Progressive destruction of the lateral RIGHT third rib consistent with metastasis.  No new osseous findings.  IMPRESSION: Enlarged RIGHT hilum likely combination of hilar adenopathy and known perihilar mass.  BILATERAL pulmonary metastases.  Increased RIGHT basilar pleural effusion and atelectasis.  Destructive lytic lesion involving the RIGHT third rib.   Electronically Signed   By: Lavonia Dana M.D.   On: 12/17/2013 10:48   Ct Head (brain) Wo  Contrast  01/06/2014   CLINICAL DATA:  Fall, left-sided weakness, difficulty ambulating  EXAM: CT HEAD WITHOUT CONTRAST  TECHNIQUE: Contiguous axial images were obtained from the base of the skull through the vertex without contrast.  COMPARISON:  05/14/2008  FINDINGS: High right parietal hyperdense hemorrhage or underlying hemorrhagic lesion noted with surrounding white matter vasogenic edema. This measures 19 x 17 mm, image 24.  No other lesions demonstrated by noncontrast CT. In the setting of trauma this could represent a hemorrhagic contusion however a hemorrhagic brain lesion/metastasis would have a similar appearance. Patient has a known history of lung cancer.  Stable brain atrophy. Minor white matter microvascular ischemic change about the lateral ventricles. No hydrocephalus, midline shift, or significant herniation. No extra-axial fluid collection. Cisterns are patent. No definite cerebellar abnormality. Orbits are symmetric. Mastoids are clear. Minor scattered sinus mucosal thickening. No skull lesion or acute fracture.  IMPRESSION: 19 mm high right parietal hemorrhagic brain lesion with surrounding vasogenic edema, favor hemorrhagic metastasis given the history of known advanced lung cancer.  These results were called by telephone at the time of interpretation on 01/06/2014 at 12:09 pm to Dr. Alfonzo Beers , who verbally acknowledged these results.   Electronically Signed   By: Daryll Brod M.D.   On: 01/06/2014 12:11   Ct Chest W Contrast  12/31/2013   CLINICAL DATA:  Lung cancer, chemotherapy complete. Right sided chest pain with shortness of breath and constipation.  EXAM: CT CHEST, ABDOMEN, AND PELVIS WITH CONTRAST  TECHNIQUE: Multidetector CT imaging of the chest, abdomen and pelvis was performed following the standard protocol during bolus administration of intravenous contrast.  CONTRAST:  161mL OMNIPAQUE IOHEXOL 300 MG/ML  SOLN  COMPARISON:  CT chest 08/18/2013.  FINDINGS: CT CHEST FINDINGS   Aright internal jugular/ high right paratracheal lymph node measures 12 mm (previously 8 mm). Mediastinal adenopathy measures up to 12 mm in the AP window (previously 8 mm). Right hilar mass measures approximately 9.2 x 9.6 cm (previously 6.0 x 7.9 cm when remeasured). No axillary adenopathy. Heart size normal. No pericardial effusion.  Moderate amount of loculated right pleural fluid, increased from the prior exam. Pleural nodules have increased in number and size in the right hemi thorax. Index nodule in the anteromedial right hemi thorax measures 2.5 cm (  previously 1.3 cm). Centrilobular emphysema. No left pleural fluid. Mild narrowing of the bronchus intermedius. Airway is otherwise unremarkable.  CT ABDOMEN AND PELVIS FINDINGS  Hepatobiliary: An intermediate density lesion in the inferior left hepatic lobe measures 9 mm and 46 Hounsfield units (series 2, image 75). Additional tiny low-attenuation lesions in the liver measure up to 3 mm, too small to characterize. Gallbladder is unremarkable. No biliary ductal dilatation.  Adrenals/urinary tract: Adrenal glands are unremarkable. Low-attenuation lesions in the kidneys measure up to 2.4 cm on the right and are likely cysts. Ureters are decompressed. Bladder is unremarkable.  Spleen: Negative.  Pancreas: Negative.  Stomach/Bowel: Stomach is unremarkable. Mild scattered gaseous prominence of small bowel, nonspecific. Small bowel, colon, small bowel mesentery and omentum are otherwise unremarkable.  Reproductive: Prostate is enlarged.  Vascular/Lymphatic: Atherosclerotic calcification of the arterial vasculature without abdominal aortic aneurysm. No pathologically enlarged lymph nodes.  Other: Small pelvic free fluid.  Musculoskeletal: Degenerative changes are seen in the hips and spine. Lytic destruction is seen in the posterolateral right third rib. A small lytic lesion is also seen in the right ninth posterior lateral rib. A small focal lucency in the medial  aspect of the left iliac wing (series 2, image 90) is nonspecific.  IMPRESSION: 1. Interval progression in stage IV lung cancer, as evidenced by an enlarging right hilar mass as well as worsening intrathoracic adenopathy and right pleural implants/effusion. 2. Right rib metastases, as before. 3. Intermediate density lesion in the inferior left hepatic lobe is indeterminate. Metastatic disease is not excluded. 4. Small pelvic free fluid. 5. Prostate enlargement.   Electronically Signed   By: Lorin Picket M.D.   On: 12/31/2013 13:27   Ct Abdomen Pelvis W Contrast  12/31/2013   CLINICAL DATA:  Lung cancer, chemotherapy complete. Right sided chest pain with shortness of breath and constipation.  EXAM: CT CHEST, ABDOMEN, AND PELVIS WITH CONTRAST  TECHNIQUE: Multidetector CT imaging of the chest, abdomen and pelvis was performed following the standard protocol during bolus administration of intravenous contrast.  CONTRAST:  159mL OMNIPAQUE IOHEXOL 300 MG/ML  SOLN  COMPARISON:  CT chest 08/18/2013.  FINDINGS: CT CHEST FINDINGS  Aright internal jugular/ high right paratracheal lymph node measures 12 mm (previously 8 mm). Mediastinal adenopathy measures up to 12 mm in the AP window (previously 8 mm). Right hilar mass measures approximately 9.2 x 9.6 cm (previously 6.0 x 7.9 cm when remeasured). No axillary adenopathy. Heart size normal. No pericardial effusion.  Moderate amount of loculated right pleural fluid, increased from the prior exam. Pleural nodules have increased in number and size in the right hemi thorax. Index nodule in the anteromedial right hemi thorax measures 2.5 cm (previously 1.3 cm). Centrilobular emphysema. No left pleural fluid. Mild narrowing of the bronchus intermedius. Airway is otherwise unremarkable.  CT ABDOMEN AND PELVIS FINDINGS  Hepatobiliary: An intermediate density lesion in the inferior left hepatic lobe measures 9 mm and 46 Hounsfield units (series 2, image 75). Additional tiny  low-attenuation lesions in the liver measure up to 3 mm, too small to characterize. Gallbladder is unremarkable. No biliary ductal dilatation.  Adrenals/urinary tract: Adrenal glands are unremarkable. Low-attenuation lesions in the kidneys measure up to 2.4 cm on the right and are likely cysts. Ureters are decompressed. Bladder is unremarkable.  Spleen: Negative.  Pancreas: Negative.  Stomach/Bowel: Stomach is unremarkable. Mild scattered gaseous prominence of small bowel, nonspecific. Small bowel, colon, small bowel mesentery and omentum are otherwise unremarkable.  Reproductive: Prostate is enlarged.  Vascular/Lymphatic:  Atherosclerotic calcification of the arterial vasculature without abdominal aortic aneurysm. No pathologically enlarged lymph nodes.  Other: Small pelvic free fluid.  Musculoskeletal: Degenerative changes are seen in the hips and spine. Lytic destruction is seen in the posterolateral right third rib. A small lytic lesion is also seen in the right ninth posterior lateral rib. A small focal lucency in the medial aspect of the left iliac wing (series 2, image 90) is nonspecific.  IMPRESSION: 1. Interval progression in stage IV lung cancer, as evidenced by an enlarging right hilar mass as well as worsening intrathoracic adenopathy and right pleural implants/effusion. 2. Right rib metastases, as before. 3. Intermediate density lesion in the inferior left hepatic lobe is indeterminate. Metastatic disease is not excluded. 4. Small pelvic free fluid. 5. Prostate enlargement.   Electronically Signed   By: Lorin Picket M.D.   On: 12/31/2013 13:27    Microbiology: No results found for this or any previous visit (from the past 240 hour(s)).   Labs: Basic Metabolic Panel:  Recent Labs Lab 01/06/14 1110 01/06/14 1123 01/07/14 0530  NA 138 139 136*  K 3.7 3.6* 3.9  CL 97 98 96  CO2 30  --  27  GLUCOSE 96 100* 149*  BUN 12 11 21   CREATININE 1.03 1.00 1.07  CALCIUM 8.9  --  9.2   Liver  Function Tests:  Recent Labs Lab 01/06/14 1110  AST 19  ALT 8  ALKPHOS 60  BILITOT 0.3  PROT 8.6*  ALBUMIN 2.2*   No results found for this basename: LIPASE, AMYLASE,  in the last 168 hours No results found for this basename: AMMONIA,  in the last 168 hours CBC:  Recent Labs Lab 01/06/14 1110 01/06/14 1123 01/07/14 0530 01/08/14 2013  WBC 5.7  --  4.2  --   NEUTROABS 3.7  --   --   --   HGB 8.9* 10.2* 8.7* 10.2*  HCT 27.9* 30.0* 27.1* 31.4*  MCV 76.0*  --  73.0*  --   PLT 333  --  367  --    Cardiac Enzymes: No results found for this basename: CKTOTAL, CKMB, CKMBINDEX, TROPONINI,  in the last 168 hours BNP: BNP (last 3 results)  Recent Labs  05/16/13 2228  PROBNP 88.2   CBG: No results found for this basename: GLUCAP,  in the last 168 hours     Signed:  Niel Hummer A  [facility] 01/09/2014, 2:29 PM

## 2014-01-09 NOTE — Progress Notes (Signed)
RM [id number]    [name]                                 [facility] MSW Note:  Had spouse sign a new revocation form as pt was not discharged yesterday but will be discharged today instead. Spouse is pleased that pt will be going to [facility] for rehab. No needs voiced. Encouraged her to call with any questions or concerns.Benedict Needy [place],LCSW

## 2014-01-12 ENCOUNTER — Other Ambulatory Visit: Payer: Self-pay | Admitting: *Deleted

## 2014-01-12 MED ORDER — OXYCODONE HCL 5 MG PO TABS: ORAL_TABLET | ORAL | Status: DC

## 2014-01-12 MED ORDER — OXYCODONE HCL 15 MG PO TABS: 15.0000 mg | ORAL_TABLET | ORAL | Status: DC | PRN

## 2014-01-12 MED ORDER — MORPHINE SULFATE 30 MG PO TABS: ORAL_TABLET | ORAL | Status: DC

## 2014-01-12 NOTE — Telephone Encounter (Signed)
[facility] 

## 2014-01-13 ENCOUNTER — Non-Acute Institutional Stay (SKILLED_NURSING_FACILITY): Payer: Medicare Other | Admitting: Internal Medicine

## 2014-01-13 DIAGNOSIS — I2510 Atherosclerotic heart disease of native coronary artery without angina pectoris: Secondary | ICD-10-CM

## 2014-01-13 DIAGNOSIS — I10 Essential (primary) hypertension: Secondary | ICD-10-CM

## 2014-01-13 DIAGNOSIS — E43 Unspecified severe protein-calorie malnutrition: Secondary | ICD-10-CM

## 2014-01-13 DIAGNOSIS — J449 Chronic obstructive pulmonary disease, unspecified: Secondary | ICD-10-CM

## 2014-01-13 DIAGNOSIS — C7931 Secondary malignant neoplasm of brain: Secondary | ICD-10-CM

## 2014-01-13 DIAGNOSIS — IMO0001 Reserved for inherently not codable concepts without codable children: Secondary | ICD-10-CM

## 2014-01-13 DIAGNOSIS — C3491 Malignant neoplasm of unspecified part of right bronchus or lung: Secondary | ICD-10-CM

## 2014-01-13 NOTE — Progress Notes (Signed)
Patient ID: [name], male   DOB: [birth year], 73 y.o.   MRN: [id number]  Provider:  Rexene Edison. Mariea Clonts, D.O., C.M.D. Location:  [facility]  PCP: [name], MD  Code Status: full code  No Known Allergies  Chief Complaint  Patient presents with  . New Admit To SNF    HPI: 73 y.o. 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  He had a new right parietal hemorrhagica brain met with vasogenic edema.  He has a right hilar mass that has increased in size.  He has right rib mets and a possible liver mets, increased intrathoracic adenopathy, right pleural implants and effusion, and a small amt of pelvic free fluid.    Upon review of records, he had chemo in July and August, but stopped 12/14 due to oral abscesses--had to have teeth pulled.  In late August, he was put on hospice care, but his wife blames hospice for his stroke (from the hemorrhagic brain mets).  9/16, they saw Dr. Julien Nordmann who recommended palliative XRT of the chest (right) and chemo.  He required a transfusion and was very confused, was having headaches.  He has lost 30 lbs in 14 mos.    ROS: Review of Systems  Constitutional: Positive for weight loss and malaise/fatigue. Negative for fever and chills.  Eyes: Negative for blurred vision.  Respiratory: Negative for shortness of breath.   Cardiovascular: Negative for chest pain and palpitations.  Gastrointestinal: Positive for abdominal pain.  Genitourinary: Negative for dysuria.  Musculoskeletal: Positive for myalgias and joint pain. Negative for falls.       Rib pain  Skin: Negative for rash.  Neurological: Positive for weakness and headaches. Negative for dizziness.  Psychiatric/Behavioral: Positive for hallucinations and memory loss.       Confusion    Past Medical History  Diagnosis Date  . Bronchitis   . Glaucoma   . Heart disease   . Hyperlipidemia   . Hypertension    . CAD (coronary artery disease)     Remote cath ?1998.   . Tobacco abuse   . Glaucoma (increased eye pressure)   . 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 S/P chemotherapy, time since greater than 12 weeks    Past Surgical History  Procedure Laterality Date  . None    . Cardiac catheterization     Social History:   reports that he has quit smoking. He has quit using smokeless tobacco. He reports that he does not drink alcohol or use illicit drugs.  Family History  Problem Relation Age of Onset  . Hyperlipidemia Mother   . Heart disease Mother   . Stroke Mother   . Hypertension Mother   . Kidney disease Mother   . Diabetes Mother   . Diabetes Father   . Hypertension Maternal Grandfather   . Hyperlipidemia Paternal Grandmother   . Diabetes Paternal Grandmother   . Heart attack Maternal Grandfather     Medications: Patient's Medications  New Prescriptions   No medications on file  Previous Medications   ALBUTEROL (PROVENTIL HFA;VENTOLIN HFA) 108 (90 BASE) MCG/ACT INHALER    Inhale 2 puffs into the lungs every 6 (six) hours as needed for wheezing or shortness of breath.   CARVEDILOL (COREG) 3.125 MG TABLET    Take 1 tablet (3.125 mg total) by mouth 2 (two) times daily with  a meal.   DOCUSATE SODIUM (COLACE) 100 MG CAPSULE    Take 100 mg by mouth daily.    ESCITALOPRAM (LEXAPRO) 10 MG TABLET    Take 10 mg by mouth daily.   FUROSEMIDE (LASIX) 20 MG TABLET    Take 20 mg by mouth daily after breakfast.    LEVETIRACETAM (KEPPRA) 500 MG TABLET    Take 1 tablet (500 mg total) by mouth 2 (two) times daily.   MULTIPLE VITAMIN (MULTIVITAMIN WITH MINERALS) TABS TABLET    Take 1 tablet by mouth 2 (two) times daily.   NYSTATIN (MYCOSTATIN) 100000 UNIT/ML SUSPENSION    Take 5 mLs by mouth 4 (four) times daily. For thrush. Swish and swallow.   OXYCODONE HCL 10 MG TABS    Take 10 mg by mouth every 4 (four) hours as needed (as needed for breakthrough pain).    PANTOPRAZOLE (PROTONIX) 40 MG TABLET    Take 1 tablet (40 mg total) by mouth daily.   POLYETHYLENE GLYCOL (MIRALAX / GLYCOLAX) PACKET    Take 17 g by mouth daily as needed for moderate constipation.    QUETIAPINE (SEROQUEL) 25 MG TABLET    Take 25 mg by mouth at bedtime.   SENNA (SENOKOT) 8.6 MG TABS TABLET    Take 1 tablet by mouth 2 (two) times daily.   Modified Medications   Modified Medication Previous Medication   DEXAMETHASONE (DECADRON) 4 MG TABLET dexamethasone (DECADRON) 4 MG tablet      Take 1 tablet (4 mg total) by mouth as directed. 4 mg four times daily for 1 week, then 4 mg three times daily for 1 week, then 4 mg twice daily for 1 week, then 2 mg twice daily for 1 week, then 2 mg once daily for 1 week, then stop    Take 1 tablet (4 mg total) by mouth every 6 (six) hours.   MORPHINE (MSIR) 30 MG TABLET morphine (MSIR) 30 MG tablet      Take one tablet by mouth every 12 hours for pain    Take one tablet by mouth twice daily as needed for severe pain  Discontinued Medications   ONDANSETRON (ZOFRAN) 8 MG TABLET    Take 8 mg by mouth 2 (two) times daily as needed for nausea or vomiting.    OXYCODONE (ROXICODONE) 15 MG IMMEDIATE RELEASE TABLET    Take 1 tablet (15 mg total) by mouth every 4 (four) hours as needed.   OXYCODONE (ROXICODONE) 5 MG IMMEDIATE RELEASE TABLET    Take three tablets by mouth every 4 hours as needed for pain   SERTRALINE (ZOLOFT) 25 MG TABLET    Take 25 mg by mouth daily with breakfast.      Physical Exam: Filed Vitals:   01/13/14 1831  BP: 160/78  Pulse: 78  Temp: 98.2 F (36.8 C)  Resp: 18  Height: _0  (1.778 m)  Weight: 132 lb (59.875 kg)  SpO2: 93%   Physical Exam  Constitutional: He appears distressed.  Cachectic black male  HENT:  Head: Normocephalic and atraumatic.  Mouth/Throat: Oropharynx is clear and moist.  Eyes: Conjunctivae are normal. Pupils are equal, round, and reactive to light.  Neck: Neck supple. No JVD present.    Cardiovascular: Normal rate, regular rhythm, normal heart sounds and intact distal pulses.   Pulmonary/Chest: Effort normal and breath sounds normal. No respiratory distress.  Abdominal: Soft. Bowel sounds are normal. He exhibits no mass. There is tenderness. There is no rebound and no guarding.  Musculoskeletal: He exhibits tenderness.  Over ribs, right upper quadrant; left hemiparesis  Neurological:  Intermittently alert, confused, hallucinating and delusional--thinks staff are his wife  Skin: Skin is warm and dry.  Psychiatric: He has a normal mood and affect.     Labs reviewed: Basic Metabolic Panel:  Recent Labs  01/07/14 0530 02/12/2014 1520 02/12/14 0244  NA 136* 145 150*  K 3.9 4.8 4.6  CL 96 104 113*  CO2 _0 GLUCOSE 149* 148* 126*  BUN 21 36* 44*  CREATININE 1.07 1.49* 1.62*  CALCIUM 9.2 9.5 8.7   Liver Function Tests:  Recent Labs  01/02/14 0824 01/06/14 1110 03/05/2014 1520  AST 20 19 66*  ALT 6 8 40  ALKPHOS 64 60 109  BILITOT 0.32 0.3 0.7  PROT 8.8* 8.6* 8.6*  ALBUMIN 2.1* 2.2* 2.6*   No results for input(s): LIPASE, AMYLASE in the last 8760 hours. No results for input(s): AMMONIA in the last 8760 hours. CBC:  Recent Labs  01/02/14 0824 01/06/14 1110  01/07/14 0530 01/08/14 2013 02/24/2014 1245 02/12/14 0244  WBC 5.8 5.7  --  4.2  --  7.7 6.7  NEUTROABS 3.8 3.7  --   --   --  6.7  --   HGB 7.3* 8.9*  < > 8.7* 10.2* 7.7* 6.0*  HCT 23.6* 27.9*  < > 27.1* 31.4* 25.1* 19.2*  MCV 71.2* 76.0*  --  73.0*  --  75.6* 74.7*  PLT 391 333  --  367  --  189 154  < > = values in this interval not displayed. Cardiac Enzymes:  Recent Labs  12/17/13 0956 02/21/2014 1245  TROPONINI <0.30 <0.30   Imaging and Procedures: Reviewed and see hpi  Assessment/Plan 1. Stage 4 lung cancer, right -with mets to liver, bone, brain -is to f/u with Dr. Julien Nordmann for more chemo and palliative xrt -not doing well so here for "therapy" -is having pain,  hallucinations, prognosis is very poor but his wife is not ready for hospice again at this time b/c she blames them for his stroke despite clearly due to progression of his disease -she has lost other family recently, and says he has stated he wants everything done  2. 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 Essential hypertension -bp was elevated today, but uncomfortable when I saw him and not alert enough to request pain meds so will need to schedule -cont coreg, lasix  4. Protein-calorie malnutrition, severe -supplements being given, needs to be fed due to weakness and confusion at this point  5. Coronary artery disease involving native coronary artery of native heart without angina pectoris -cont coreg, lasix, off anticoagulation due to hemorrhagic mets, off statin due to prognosis  6. COPD bronchitis -cont albuterol--will probably need to change to neb from inhaler   Functional status: dependent in adls  Family/ staff Communication: discussed with wife who was present; seen with unit supervisor  Labs/tests ordered: none

## 2014-01-14 ENCOUNTER — Encounter: Payer: Self-pay | Admitting: Radiation Oncology

## 2014-01-14 ENCOUNTER — Ambulatory Visit
Admit: 2014-01-14 | Discharge: 2014-01-14 | Disposition: A | Discharge status: Home / Self Care | Payer: Medicare Other | Attending: Radiation Oncology | Admitting: Radiation Oncology

## 2014-01-14 DIAGNOSIS — C7949 Secondary malignant neoplasm of other parts of nervous system: Principal | ICD-10-CM

## 2014-01-14 DIAGNOSIS — C7931 Secondary malignant neoplasm of brain: Secondary | ICD-10-CM

## 2014-01-14 MED ORDER — GADOBENATE DIMEGLUMINE 529 MG/ML IV SOLN
13.0000 mL | Freq: Once | INTRAVENOUS | Status: AC | PRN
  Administered 2014-01-14: 13 mL via INTRAVENOUS

## 2014-01-14 NOTE — Progress Notes (Signed)
Location/Histology of Brain Tumor: non small cell lung ca with hemorrhagic right frontal lesion and vasogenic edema  Patient presented with symptoms of:  "jumping" left leg, numbness in both feet, and difficult walking.   Past or anticipated interventions, if any, per neurosurgery: yes, decadron, keppra and referral to Dr. Tammi Klippel for [facility]  Past or anticipated interventions, if any, per medical oncology: yes, history of chemotherapy at [facility] in 2014 stopped early due recurrent oral abcesses  Dose of Decadron, if applicable: yes, decadron 4 mg every six hours  Recent neurologic symptoms, if any:   Seizures: no  Headaches: no  Nausea: no  Dizziness/ataxia: no  Difficulty with hand coordination: no  Focal numbness/weakness: yes  Visual deficits/changes: no  Confusion/Memory deficits: no  Painful bone metastases at present, if any: no Increased SOB. Placed on continuous oxygen therapy.  SAFETY ISSUES:  Prior radiation? no  Pacemaker/ICD? no  Possible current pregnancy? no  Is the patient on methotrexate? no  Additional Complaints / other details: 73 year old male. Married. Started hospice in late August.

## 2014-01-15 ENCOUNTER — Ambulatory Visit
Admit: 2014-01-15 | Discharge: 2014-01-15 | Disposition: A | Discharge status: Home / Self Care | Payer: Medicare Other | Source: Ambulatory Visit | Attending: Radiation Oncology | Admitting: Radiation Oncology

## 2014-01-15 ENCOUNTER — Ambulatory Visit
Admit: 2014-01-15 | Discharge: 2014-01-15 | Disposition: A | Discharge status: Home / Self Care | Payer: Medicare Other | Attending: Radiation Oncology | Admitting: Radiation Oncology

## 2014-01-15 ENCOUNTER — Encounter: Payer: Self-pay | Admitting: Radiation Oncology

## 2014-01-15 VITALS — BP 130/60 | HR 64

## 2014-01-15 DIAGNOSIS — Z510 Encounter for antineoplastic radiation therapy: Secondary | ICD-10-CM | POA: Diagnosis present

## 2014-01-15 DIAGNOSIS — C3491 Malignant neoplasm of unspecified part of right bronchus or lung: Secondary | ICD-10-CM

## 2014-01-15 DIAGNOSIS — C7931 Secondary malignant neoplasm of brain: Secondary | ICD-10-CM

## 2014-01-15 DIAGNOSIS — I10 Essential (primary) hypertension: Secondary | ICD-10-CM | POA: Insufficient documentation

## 2014-01-15 DIAGNOSIS — C3431 Malignant neoplasm of lower lobe, right bronchus or lung: Secondary | ICD-10-CM | POA: Insufficient documentation

## 2014-01-15 DIAGNOSIS — M79621 Pain in right upper arm: Secondary | ICD-10-CM | POA: Diagnosis not present

## 2014-01-15 DIAGNOSIS — R251 Tremor, unspecified: Secondary | ICD-10-CM | POA: Diagnosis not present

## 2014-01-15 DIAGNOSIS — R531 Weakness: Secondary | ICD-10-CM | POA: Insufficient documentation

## 2014-01-15 DIAGNOSIS — R05 Cough: Secondary | ICD-10-CM | POA: Diagnosis not present

## 2014-01-15 NOTE — Progress Notes (Signed)
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 male who in July of 2014 started complaining of cough productive of bloody sputum. He was seen at the [facility] in [place], [place]. Chest x-ray showed findings concerning for bronchogenic carcinoma. This was followed by CT scan of the chest, abdomen and pelvis at that time. It showed a mass within the superior segment of the right lower lobe measuring 3.4 x 3.2 x 3.6 CM with multiple additional right sided pulmonary nodules. There was a right pleural mass and hilar adenopathy. These findings were concerning for metastatic lung cancer. A PET scan was performed on 10/23/2012 and it showed the right lower lobe pulmonary mass with multiple satellite lesions throughout the right lung and right pleural space as well as right hilar hypermetabolic lymphadenopathy concerning for primary lung malignancy with metastatic disease. On 11/04/2012 the patient underwent CT-guided right paravertebral pleural mass biopsy by interventional radiology at the [facility]. The final pathology Case # [id number] showed malignant cells consistent with adenocarcinoma. The malignant cells were immunoreactive with CK 7 and TTF-1 and nonreactive with CK 20 and CDX-2. The morphology and immunophenotype were consistent with adenocarcinoma of lung primary. The tissue blocks were sent for molecular testing and has negative EGFR mutation. MRI of the brain was performed on 11/15/2012 and was negative for malignancy. The patient was initially started on treatment with systemic chemotherapy with carboplatin for AUC  of 5 and paclitaxel 200 mg/M2 for 2 cycles. Avastin 15 mg/kg was added to this regimen starting from cycle #3 and cycle #4. Restaging scan on 01/24/2013 showed response to the treatment with interval decrease in the dominant right infrahilar pulmonary mass as well as decreasing size of multiple pleural and parenchymal pulmonary nodules. There was no evidence for metastatic disease within the abdomen or pelvis. His last dose number 4 of the induction chemotherapy was given on 02/05/2013 followed by maintenance Avastin 15 mg/KG every 3 weeks. On 03/16/2013 Avastin treatment was placed on hold secondary to proteinuria followed by dental abscess.  Restaging workup on 07/09/2013 showed disease progression in the right hemothorax, pleural space and mediastinum in addition to minimal increase in the size of the subcentimeter liver lesions raising possibility of liver metastasis. There was also increased metastatic involvement of the ribs.  The patient was started on second line chemotherapy with single agent Alimta at 500 mg/M2 between 07/23/2013 until 10/08/2013 for a total of 4 cycles but this was discontinued after restaging scan on 10/24/2013 showed evidence for disease progression with increase in the size of the right hilar mass, multiple right pulmonary masses, right hilar adenopathy and a right pleural nodularity. There was a stable hepatic lesions and worsening osseous metastatic disease in the right ninth posterior rib.  The patient was started on treatment with Nivolumab 3 MG/KG on 11/05/2013 status post 1 cycle but because of decline in his general condition he was referred to hospice on 11/19/2013. MRI of the brain at that time showed no evidence for metastatic disease to the brain.  The patient and his wife were not satisfied with the hospice referral and he came to Dr.  Mohamed on 12/24/13 for evaluation and discussion of his treatment options.  Subsequently, he presentied to the ED with several days of  worsening left-sided weakness. He and his wife state that this past weekend he noted some difficulty with weakness of his left arm, and some instability while walking. He notes some numbness of his left leg as well as a sensation of "bone-on-bone" on his left chest wall. He did not have associated HA, but does c/o intermittent episodes of dizziness over the last few days. He denies any changes in vision, nor does he have any right-sided symptoms. He does state that he has had some intermittent "tremors" or "cramps" of the left leg over the last 2 days.  Of note, he does have a history of metastatic non-small cell lung CA, treated with 2 cycles of chemotherapy. He as been seen by Dr. Earlie Server approximately 2 weeks ago and has previously undergone surveilance brain MRI a few months ago, reportedly negative  Head CT on on 01/09/14 showed a solitary brain metastasis was pronounced vasogenic edema. He was admitted, started on steroids, and after improvement discharged.  MRI brain on 01/14/14 further delineated the hemorrhagic and enhancing intra-axial mass in the right parietal lobe situated immediately posterior to the right sensory strip, measuring 27 x 25 x 29 mm with abundant surrounding T2 and FLAIR hyperintensity in a vasogenic edema pattern. Mild to moderate regional mass effect. No midline shift.  No other abnormal enhancement identified    PREVIOUS RADIATION THERAPY: No  PAST MEDICAL HISTORY:  has a past medical history of Bronchitis; Glaucoma; Heart disease; Hyperlipidemia; Hypertension; CAD (coronary artery disease); Tobacco abuse; Glaucoma (increased eye pressure); Lung cancer; and S/P chemotherapy, time since greater than 12 weeks.    PAST SURGICAL HISTORY: Past Surgical History  Procedure Laterality Date  . None    . Cardiac catheterization      FAMILY HISTORY: family history includes Diabetes in his father, mother, and paternal grandmother; Heart attack in his maternal grandfather; Heart  disease in his mother; Hyperlipidemia in his mother and paternal grandmother; Hypertension in his maternal grandfather and mother; Kidney disease in his mother; Stroke in his mother.  SOCIAL HISTORY:  reports that he has quit smoking. He has quit using smokeless tobacco. He reports that he does not drink alcohol or use illicit drugs.  ALLERGIES: Review of patient's allergies indicates no known allergies.  MEDICATIONS:  Current Outpatient Prescriptions  Medication Sig Dispense Refill  . albuterol (PROVENTIL HFA;VENTOLIN HFA) 108 (90 BASE) MCG/ACT inhaler Inhale 2 puffs into the lungs every 6 (six) hours as needed for wheezing or shortness of breath.  1 Inhaler  0  . carvedilol (COREG) 3.125 MG tablet Take 1 tablet (3.125 mg total) by mouth 2 (two) times daily with a meal.  30 tablet  0  . dexamethasone (DECADRON) 4 MG tablet Take 1 tablet (4 mg total) by mouth every 6 (six) hours.  90 tablet  0  . docusate sodium (COLACE) 100 MG capsule Take 100 mg by mouth daily.       . furosemide (LASIX) 20 MG tablet Take 20 mg by mouth daily after breakfast.       . levETIRAcetam (KEPPRA) 500 MG tablet Take 1 tablet (500 mg total) by mouth 2 (two) times daily.  60 tablet  0  . morphine (MSIR) 30 MG tablet Take one tablet by mouth twice daily as needed for severe pain  60 tablet  0  . ondansetron (ZOFRAN) 8 MG tablet Take  8 mg by mouth 2 (two) times daily as needed for nausea or vomiting.       Marland Kitchen oxyCODONE (ROXICODONE) 15 MG immediate release tablet Take 1 tablet (15 mg total) by mouth every 4 (four) hours as needed.  180 tablet  0  . oxyCODONE (ROXICODONE) 5 MG immediate release tablet Take three tablets by mouth every 4 hours as needed for pain  360 tablet  0  . pantoprazole (PROTONIX) 40 MG tablet Take 1 tablet (40 mg total) by mouth daily.  30 tablet  0  . polyethylene glycol (MIRALAX / GLYCOLAX) packet Take 17 g by mouth daily as needed for moderate constipation.       . senna (SENOKOT) 8.6 MG TABS tablet  Take 1 tablet by mouth 2 (two) times daily.       . sertraline (ZOLOFT) 25 MG tablet Take 25 mg by mouth daily with breakfast.        No current facility-administered medications for this encounter.    REVIEW OF SYSTEMS:  A 15 point review of systems is documented in the electronic medical record. This was obtained by the nursing staff. However, I reviewed this with the patient to discuss relevant findings and make appropriate changes.  Pertinent items are noted in HPI.   PHYSICAL EXAM:  vitals were not taken for this visit.  Per Neurosurgery: on 01/06/2014 prior steroids  NEUROLOGIC EXAM:  Awake, alert, oriented  Memory and concentration grossly intact  Speech fluent, appropriate  CN grossly intact  Motor exam:  Upper Extremities  Deltoid  Bicep  Tricep  Grip   Right  5/5  5/5  5/5  5/5   Left  5/5  5/5  5/5  5/5    Lower Extremity  IP  Quad  PF  DF  EHL   Right  5/5  5/5  5/5  5/5  5/5   Left  5/5  5/5  5/5  4-/5  4/5   Left pronator drift (+)  Sensation decreased to LT in LLE  On my exam today, patient remains hemiparetic on the left and also appears to show left sided hemi-inattention.     KPS = 40  100 - Normal; no complaints; no evidence of disease. 90   - Able to carry on normal activity; minor signs or symptoms of disease. 80   - Normal activity with effort; some signs or symptoms of disease. 30   - Cares for self; unable to carry on normal activity or to do active work. 60   - Requires occasional assistance, but is able to care for most of his personal needs. 50   - Requires considerable assistance and frequent medical care. 75   - Disabled; requires special care and assistance. 68   - Severely disabled; hospital admission is indicated although death not imminent. 19   - Very sick; hospital admission necessary; active supportive treatment necessary. 10   - Moribund; fatal processes progressing rapidly. 0     - Dead  Karnofsky DA, Abelmann Uehling, Craver LS and  [facility] [phone]) The use of the nitrogen mustards in the palliative treatment of carcinoma: with particular reference to bronchogenic carcinoma Cancer 1 [id number]  LABORATORY DATA:  Lab Results  Component Value Date   WBC 4.2 01/07/2014   HGB 10.2* 01/08/2014   HCT 31.4* 01/08/2014   MCV 73.0* 01/07/2014   PLT 367 01/07/2014   Lab Results  Component Value Date   NA 136* 01/07/2014   K 3.9  01/07/2014   CL 96 01/07/2014   CO2 27 01/07/2014   Lab Results  Component Value Date   ALT 8 01/06/2014   AST 19 01/06/2014   ALKPHOS 60 01/06/2014   BILITOT 0.3 01/06/2014     RADIOGRAPHY: Dg Chest 2 View  12/17/2013   CLINICAL DATA:  Lung cancer, shortness of breath, flank pain, history of smoking, hypertension, coronary artery disease  EXAM: CHEST  2 VIEW  COMPARISON:  08/17/2013; correlation CT chest 08/18/2013  FINDINGS: Normal heart size and pulmonary vascularity.  Enlarged RIGHT hilum likely combination of hilar adenopathy and known perihilar mass.  Additional nodular foci more peripherally in the RIGHT middle and RIGHT lower lobes compatible with pulmonary metastases.  Mild RIGHT basilar atelectasis and small effusion.  LEFT lung grossly clear.  Probable 15 mm LEFT mid lung nodule as well.  No definite infiltrate or pneumothorax.  Progressive destruction of the lateral RIGHT third rib consistent with metastasis.  No new osseous findings.  IMPRESSION: Enlarged RIGHT hilum likely combination of hilar adenopathy and known perihilar mass.  BILATERAL pulmonary metastases.  Increased RIGHT basilar pleural effusion and atelectasis.  Destructive lytic lesion involving the RIGHT third rib.   Electronically Signed   By: Lavonia Dana M.D.   On: 12/17/2013 10:48   Ct Head Wo Contrast  01/09/2014   CLINICAL DATA:  Encephalopathy, followup, continuous involuntary movement, history coronary artery disease, hyperlipidemia, hypertension, smoking, stage fixed for RIGHT lung cancer  EXAM: CT HEAD WITHOUT CONTRAST  TECHNIQUE:  Contiguous axial images were obtained from the base of the skull through the vertex without intravenous contrast.  COMPARISON:  01/06/2014  FINDINGS: Generalized atrophy.  Stable ventricular morphology.  No midline shift or mass effect.  Hemorrhagic ring-like mass at RIGHT vertex, 21 x 19 mm, previously 19 x 17 mm, question differences in slice averaging.  Surrounding vasogenic edema at RIGHT vertex lesion.  No additional intracranial hemorrhage or mass.  No evidence of acute infarction.  Small vessel chronic ischemic changes of deep cerebral white matter.  No extra-axial fluid collections.  Bones and sinuses unremarkable.  IMPRESSION: Again identified probable hemorrhagic metastasis at RIGHT vertex 21 x 19 mm in size with surrounding vasogenic edema.  Atrophy with small vessel chronic ischemic changes of deep cerebral white matter.  No new intracranial abnormalities.   Electronically Signed   By: Lavonia Dana M.D.   On: 01/09/2014 12:01   Ct Head (brain) Wo Contrast  01/06/2014   CLINICAL DATA:  Fall, left-sided weakness, difficulty ambulating  EXAM: CT HEAD WITHOUT CONTRAST  TECHNIQUE: Contiguous axial images were obtained from the base of the skull through the vertex without contrast.  COMPARISON:  05/14/2008  FINDINGS: High right parietal hyperdense hemorrhage or underlying hemorrhagic lesion noted with surrounding white matter vasogenic edema. This measures 19 x 17 mm, image 24.  No other lesions demonstrated by noncontrast CT. In the setting of trauma this could represent a hemorrhagic contusion however a hemorrhagic brain lesion/metastasis would have a similar appearance. Patient has a known history of lung cancer.  Stable brain atrophy. Minor white matter microvascular ischemic change about the lateral ventricles. No hydrocephalus, midline shift, or significant herniation. No extra-axial fluid collection. Cisterns are patent. No definite cerebellar abnormality. Orbits are symmetric. Mastoids are clear.  Minor scattered sinus mucosal thickening. No skull lesion or acute fracture.  IMPRESSION: 19 mm high right parietal hemorrhagic brain lesion with surrounding vasogenic edema, favor hemorrhagic metastasis given the history of known advanced lung cancer.  These results  were called by telephone at the time of interpretation on 01/06/2014 at 12:09 pm to Dr. Alfonzo Beers , who verbally acknowledged these results.   Electronically Signed   By: Daryll Brod M.D.   On: 01/06/2014 12:11   Ct Chest W Contrast  12/31/2013   CLINICAL DATA:  Lung cancer, chemotherapy complete. Right sided chest pain with shortness of breath and constipation.  EXAM: CT CHEST, ABDOMEN, AND PELVIS WITH CONTRAST  TECHNIQUE: Multidetector CT imaging of the chest, abdomen and pelvis was performed following the standard protocol during bolus administration of intravenous contrast.  CONTRAST:  1108m OMNIPAQUE IOHEXOL 300 MG/ML  SOLN  COMPARISON:  CT chest 08/18/2013.  FINDINGS: CT CHEST FINDINGS  Aright internal jugular/ high right paratracheal lymph node measures 12 mm (previously 8 mm). Mediastinal adenopathy measures up to 12 mm in the AP window (previously 8 mm). Right hilar mass measures approximately 9.2 x 9.6 cm (previously 6.0 x 7.9 cm when remeasured). No axillary adenopathy. Heart size normal. No pericardial effusion.  Moderate amount of loculated right pleural fluid, increased from the prior exam. Pleural nodules have increased in number and size in the right hemi thorax. Index nodule in the anteromedial right hemi thorax measures 2.5 cm (previously 1.3 cm). Centrilobular emphysema. No left pleural fluid. Mild narrowing of the bronchus intermedius. Airway is otherwise unremarkable.  CT ABDOMEN AND PELVIS FINDINGS  Hepatobiliary: An intermediate density lesion in the inferior left hepatic lobe measures 9 mm and 46 Hounsfield units (series 2, image 75). Additional tiny low-attenuation lesions in the liver measure up to 3 mm, too small to  characterize. Gallbladder is unremarkable. No biliary ductal dilatation.  Adrenals/urinary tract: Adrenal glands are unremarkable. Low-attenuation lesions in the kidneys measure up to 2.4 cm on the right and are likely cysts. Ureters are decompressed. Bladder is unremarkable.  Spleen: Negative.  Pancreas: Negative.  Stomach/Bowel: Stomach is unremarkable. Mild scattered gaseous prominence of small bowel, nonspecific. Small bowel, colon, small bowel mesentery and omentum are otherwise unremarkable.  Reproductive: Prostate is enlarged.  Vascular/Lymphatic: Atherosclerotic calcification of the arterial vasculature without abdominal aortic aneurysm. No pathologically enlarged lymph nodes.  Other: Small pelvic free fluid.  Musculoskeletal: Degenerative changes are seen in the hips and spine. Lytic destruction is seen in the posterolateral right third rib. A small lytic lesion is also seen in the right ninth posterior lateral rib. A small focal lucency in the medial aspect of the left iliac wing (series 2, image 90) is nonspecific.  IMPRESSION: 1. Interval progression in stage IV lung cancer, as evidenced by an enlarging right hilar mass as well as worsening intrathoracic adenopathy and right pleural implants/effusion. 2. Right rib metastases, as before. 3. Intermediate density lesion in the inferior left hepatic lobe is indeterminate. Metastatic disease is not excluded. 4. Small pelvic free fluid. 5. Prostate enlargement.   Electronically Signed   By: MLorin PicketM.D.   On: 12/31/2013 13:27   Mr BJeri CosWQIContrast  01/14/2014   CLINICAL DATA:  73year old male with stage IV lung cancer, metastatic disease to the brain. Stereotactic radiosurgery targeting. Subsequent encounter.  EXAM: MRI HEAD WITHOUT AND WITH CONTRAST  TECHNIQUE: Multiplanar, multiecho pulse sequences of the brain and surrounding structures were obtained without and with intravenous contrast.  CONTRAST:  173mMULTIHANCE GADOBENATE DIMEGLUMINE 529  MG/ML IV SOLN  COMPARISON:  Head CT without contrast 01/09/2014 and earlier.  FINDINGS: Hemorrhagic and enhancing intra-axial mass in the right parietal lobe situated immediately posterior to the right sensory strip  re- identified, measuring 27 x 25 x 29 mm (AP by transverse by CC) abundance surrounding T2 and FLAIR hyperintensity in a vasogenic edema pattern. Mild to moderate regional mass effect. No midline shift.  No other abnormal enhancement identified.  Small right midbrain developmental venous anomaly.  There is abnormal T2 and FLAIR hyperintensity in the left inferior frontal gyrus (series 7, images 9-11), but there is no associated enhancement or mass effect, and this most resembles Mild chronic encephalomalacia. It is new since 2010 but present more recently.  No ventriculomegaly. Patent basilar cisterns. No restricted diffusion or evidence of acute infarction. Major intracranial vascular flow voids are preserved. Negative pituitary and cervicomedullary junction. Grossly negative visualized cervical spine. No destructive osseous lesion identified. Visible internal auditory structures appear normal. Mastoids are clear. Small fluid level in the left sphenoid sinus. Minor paranasal sinus mucosal thickening elsewhere. Visualized orbit soft tissues are within normal limits. Visualized scalp soft tissues are within normal limits.  IMPRESSION: 1. Solitary intra-axial hemorrhagic an enhancing right parietal lobe mass measuring up to 29 mm. This is situated immediately posterior to the right sensory strip, with abundant vasogenic edema stable mass effect with no midline shift. 2. No other metastatic disease or acute intracranial abnormality identified. 3. Mild left anterior inferior frontal gyrus encephalomalacia, nonspecific but could be the sequelae of remote trauma.   Electronically Signed   By: Lars Pinks M.D.   On: 01/14/2014 14:42   Ct Abdomen Pelvis W Contrast  12/31/2013   CLINICAL DATA:  Lung cancer,  chemotherapy complete. Right sided chest pain with shortness of breath and constipation.  EXAM: CT CHEST, ABDOMEN, AND PELVIS WITH CONTRAST  TECHNIQUE: Multidetector CT imaging of the chest, abdomen and pelvis was performed following the standard protocol during bolus administration of intravenous contrast.  CONTRAST:  146m OMNIPAQUE IOHEXOL 300 MG/ML  SOLN  COMPARISON:  CT chest 08/18/2013.  FINDINGS: CT CHEST FINDINGS  Aright internal jugular/ high right paratracheal lymph node measures 12 mm (previously 8 mm). Mediastinal adenopathy measures up to 12 mm in the AP window (previously 8 mm). Right hilar mass measures approximately 9.2 x 9.6 cm (previously 6.0 x 7.9 cm when remeasured). No axillary adenopathy. Heart size normal. No pericardial effusion.  Moderate amount of loculated right pleural fluid, increased from the prior exam. Pleural nodules have increased in number and size in the right hemi thorax. Index nodule in the anteromedial right hemi thorax measures 2.5 cm (previously 1.3 cm). Centrilobular emphysema. No left pleural fluid. Mild narrowing of the bronchus intermedius. Airway is otherwise unremarkable.  CT ABDOMEN AND PELVIS FINDINGS  Hepatobiliary: An intermediate density lesion in the inferior left hepatic lobe measures 9 mm and 46 Hounsfield units (series 2, image 75). Additional tiny low-attenuation lesions in the liver measure up to 3 mm, too small to characterize. Gallbladder is unremarkable. No biliary ductal dilatation.  Adrenals/urinary tract: Adrenal glands are unremarkable. Low-attenuation lesions in the kidneys measure up to 2.4 cm on the right and are likely cysts. Ureters are decompressed. Bladder is unremarkable.  Spleen: Negative.  Pancreas: Negative.  Stomach/Bowel: Stomach is unremarkable. Mild scattered gaseous prominence of small bowel, nonspecific. Small bowel, colon, small bowel mesentery and omentum are otherwise unremarkable.  Reproductive: Prostate is enlarged.   Vascular/Lymphatic: Atherosclerotic calcification of the arterial vasculature without abdominal aortic aneurysm. No pathologically enlarged lymph nodes.  Other: Small pelvic free fluid.  Musculoskeletal: Degenerative changes are seen in the hips and spine. Lytic destruction is seen in the posterolateral right third rib.  A small lytic lesion is also seen in the right ninth posterior lateral rib. A small focal lucency in the medial aspect of the left iliac wing (series 2, image 90) is nonspecific.  IMPRESSION: 1. Interval progression in stage IV lung cancer, as evidenced by an enlarging right hilar mass as well as worsening intrathoracic adenopathy and right pleural implants/effusion. 2. Right rib metastases, as before. 3. Intermediate density lesion in the inferior left hepatic lobe is indeterminate. Metastatic disease is not excluded. 4. Small pelvic free fluid. 5. Prostate enlargement.   Electronically Signed   By: Lorin Picket M.D.   On: 12/31/2013 13:27   IMPRESSION: This patient is a very nice 73 year old gentleman with a solitary 29 mm right parietal brain metastasis involving the right motor strip and sensory cortex. There is significant vasogenic edema. He is debilitated by the brain tumor and may benefit from treatment. He is not a surgical candidate because of the location and proximity to the motor strip.  At this point, the patient would potentially benefit from radiotherapy. The options include whole brain irradiation versus stereotactic radiosurgery. There are pros and cons associated with each of these potential treatment options. Whole brain radiotherapy would treat the known metastatic deposits and help provide some reduction of risk for future brain metastases. However, whole brain radiotherapy carries potential risks including hair loss, subacute somnolence, and neurocognitive changes including a possible reduction in short-term memory. Whole brain radiotherapy also may carry a lower  likelihood of tumor control at the treatment sites because of the low-dose used. Stereotactic radiosurgery carries a higher likelihood for local tumor control at the targeted sites with lower associated risk for neurocognitive changes such as memory loss. However, the use of stereotactic radiosurgery in this setting may leave the patient at increased risk for new brain metastases elsewhere in the brain as high as 50-60%. Accordingly, patients who receive stereotactic radiosurgery in this setting should undergo ongoing surveillance imaging with brain MRI more frequently in order to identify and treat new small brain metastases before they become symptomatic. Stereotactic radiosurgery does carry some different risks, including a risk of radionecrosis.  PLAN: Today, I reviewed the findings and workup thus far with the patient. We discussed the dilemma regarding whole brain radiotherapy versus stereotactic radiosurgery. We discussed the pros and cons of each. We also discussed the logistics and delivery of each. We reviewed the results associated with each of the treatments described above. The patient seems to understand the treatment options and would like to proceed with stereotactic radiosurgery.  I spent 60 minutes minutes face to face with the patient and more than 50% of that time was spent in counseling and/or coordination of care.  ------------------------------------------------  Sheral Apley. Tammi Klippel, M.D.

## 2014-01-15 NOTE — Progress Notes (Signed)
Please see the Nurse Progress Note in the MD Initial Consult Encounter for this patient. 

## 2014-01-15 NOTE — Progress Notes (Signed)
Patient here with wife for consultation of lung cancer with brain mets.He is resident of [facility] and was recently released from hospice care to pursue further treatment.Patient oriented to self only.denies headache, nausea or seizure activity but does have uncontrollable tremors, weakness, pain under right arm.Unable to bear weight.No coordination.He is on steroid therapy.Not able to follow commands.

## 2014-01-15 NOTE — Progress Notes (Signed)
  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  Identity was confirmed.  All relevant records and images related to the planned course of therapy were reviewed.  The patient freely provided informed written consent to proceed with treatment after reviewing the details related to the planned course of therapy. The consent form was witnessed and verified by the simulation staff. Intravenous access was established for contrast administration. Then, the patient was set-up in a stable reproducible supine position for radiation therapy.  A relocatable thermoplastic stereotactic head frame was fabricated for precise immobilization.  CT images were obtained.  Surface markings were placed.  The CT images were loaded into the planning software and fused with the patient's targeting MRI scan.  Then the target and avoidance structures were contoured.  Treatment planning then occurred.  The radiation prescription was entered and confirmed.  I have requested 3D planning  I have requested a DVH of the following structures: Brain stem, brain, left eye, right eye, lenses, optic chiasm, target volumes, uninvolved brain, and normal tissue.    PLAN:  The patient will receive 18 Gy in one fraction.  ________________________________  Sheral Apley Tammi Klippel, M.D.

## 2014-01-15 NOTE — Addendum Note (Signed)
Encounter addended by: Arlyss Repress, RN on: 01/15/2014  3:31 PM<BR>     Documentation filed: Charges VN

## 2014-01-15 NOTE — Addendum Note (Signed)
Encounter addended by: Arlyss Repress, RN on: 01/15/2014  3:20 PM<BR>     Documentation filed: Flowsheet VN

## 2014-01-16 DIAGNOSIS — Z510 Encounter for antineoplastic radiation therapy: Secondary | ICD-10-CM | POA: Diagnosis not present

## 2014-01-19 DIAGNOSIS — Z510 Encounter for antineoplastic radiation therapy: Secondary | ICD-10-CM | POA: Diagnosis not present

## 2014-01-20 ENCOUNTER — Encounter: Payer: Self-pay | Admitting: Internal Medicine

## 2014-01-20 ENCOUNTER — Non-Acute Institutional Stay (SKILLED_NURSING_FACILITY): Payer: Medicare Other | Admitting: Internal Medicine

## 2014-01-20 DIAGNOSIS — J449 Chronic obstructive pulmonary disease, unspecified: Secondary | ICD-10-CM

## 2014-01-20 DIAGNOSIS — C3491 Malignant neoplasm of unspecified part of right bronchus or lung: Secondary | ICD-10-CM

## 2014-01-20 DIAGNOSIS — IMO0001 Reserved for inherently not codable concepts without codable children: Secondary | ICD-10-CM

## 2014-01-20 DIAGNOSIS — C7931 Secondary malignant neoplasm of brain: Secondary | ICD-10-CM

## 2014-01-20 DIAGNOSIS — E43 Unspecified severe protein-calorie malnutrition: Secondary | ICD-10-CM

## 2014-01-20 NOTE — Progress Notes (Signed)
Patient ID: [name], male   DOB: [birth year], 73 y.o.   MRN: [id number]  Location:  [facility] SN[facility] Provider:  Rexene Edison. Mariea Clonts, D.O., C.M.D.  Code Status:  Full code  Chief Complaint  Patient presents with  . Acute Visit    increased pain and current regimen is ineffective    HPI:  73 yo black male with h/o stage IV metastatic lung cancer most recently with frontoparietal solitary lesion here for rehab.  He is not doing well.  He has increased pain despite morphine 8m po bid prn severe pain and oxycodone 161mpo q 4 hrs prn moderate pain.  He and his wife do not want hospice care.  Review of Systems:  Review of Systems  Constitutional: Positive for malaise/fatigue. Negative for fever and chills.  Respiratory: Negative for shortness of breath.   Cardiovascular: Positive for chest pain.  Gastrointestinal: Positive for abdominal pain and constipation. Negative for diarrhea, blood in stool and melena.  Genitourinary: Negative for dysuria.  Musculoskeletal: Positive for myalgias and joint pain.       Rib pain  Neurological: Positive for weakness and headaches. Negative for dizziness.  Endo/Heme/Allergies: Does not bruise/bleed easily.  Psychiatric/Behavioral: Positive for hallucinations and memory loss.       Delirium    Medications: Patient's Medications  New Prescriptions   No medications on file  Previous Medications   ALBUTEROL (PROVENTIL HFA;VENTOLIN HFA) 108 (90 BASE) MCG/ACT INHALER    Inhale 2 puffs into the lungs every 6 (six) hours as needed for wheezing or shortness of breath.   CARVEDILOL (COREG) 3.125 MG TABLET    Take 1 tablet (3.125 mg total) by mouth 2 (two) times daily with a meal.   DEXAMETHASONE (DECADRON) 4 MG TABLET    Take 1 tablet (4 mg total) by mouth every 6 (six) hours.   DOCUSATE SODIUM (COLACE) 100 MG CAPSULE    Take 100 mg by mouth daily.    ESCITALOPRAM (LEXAPRO) 10 MG TABLET    Take 10 mg by mouth daily.   FUROSEMIDE (LASIX) 20 MG  TABLET    Take 20 mg by mouth daily after breakfast.    LEVETIRACETAM (KEPPRA) 500 MG TABLET    Take 1 tablet (500 mg total) by mouth 2 (two) times daily.   MORPHINE (MSIR) 30 MG TABLET    Take one tablet by mouth twice daily as needed for severe pain   NYSTATIN (MYCOSTATIN) 100000 UNIT/ML SUSPENSION    Take 5 mLs by mouth 4 (four) times daily.   ONDANSETRON (ZOFRAN) 8 MG TABLET    Take 8 mg by mouth 2 (two) times daily as needed for nausea or vomiting.    OXYCODONE (ROXICODONE) 15 MG IMMEDIATE RELEASE TABLET    Take 1 tablet (15 mg total) by mouth every 4 (four) hours as needed.   PANTOPRAZOLE (PROTONIX) 40 MG TABLET    Take 1 tablet (40 mg total) by mouth daily.   POLYETHYLENE GLYCOL (MIRALAX / GLYCOLAX) PACKET    Take 17 g by mouth daily as needed for moderate constipation.    SENNA (SENOKOT) 8.6 MG TABS TABLET    Take 1 tablet by mouth 2 (two) times daily.    SERTRALINE (ZOLOFT) 25 MG TABLET    Take 25 mg by mouth daily with breakfast.   Modified Medications   No medications on file  Discontinued Medications   OXYCODONE (ROXICODONE) 5 MG IMMEDIATE RELEASE TABLET    Take three tablets by mouth every 4  hours as needed for pain    Physical Exam: Filed Vitals:   01/20/14 1004  BP: 128/57  Pulse: 58  Temp: 97.5 F (36.4 C)  Resp: 20  Height: _0  (1.778 m)  Weight: 132 lb (59.875 kg)  SpO2: 93%  Physical Exam  Constitutional:  Cachectic black male resting in bed  Cardiovascular: Normal rate, regular rhythm, normal heart sounds and intact distal pulses.   Pulmonary/Chest: Effort normal and breath sounds normal.  Tender over ribs  Abdominal: Soft. Bowel sounds are normal. He exhibits no distension and no mass. There is tenderness.  RUQ tender  Neurological:  intermittently alert, is hallucinating, grabbing at items that aren't there, very confused  Skin: Skin is warm and dry.    Labs reviewed: Basic Metabolic Panel:  Recent Labs  01/02/14 0824 01/06/14 1110 01/06/14 1123  01/07/14 0530  NA 141 138 139 136*  K 3.5 3.7 3.6* 3.9  CL  --  97 98 96  CO2 34* 30  --  27  GLUCOSE 120 96 100* 149*  BUN 13._1 CREATININE 1.2 1.03 1.00 1.07  CALCIUM 9.3 8.9  --  9.2    Liver Function Tests:  Recent Labs  12/24/13 1303 01/02/14 0824 01/06/14 1110  AST _2 ALT _3 ALKPHOS 63 64 60  BILITOT 0.20 0.32 0.3  PROT 8.9* 8.8* 8.6*  ALBUMIN 2.2* 2.1* 2.2*    CBC:  Recent Labs  12/24/13 1303 01/02/14 0824 01/06/14 1110 01/06/14 1123 01/07/14 0530 01/08/14 2013  WBC 6.1 5.8 5.7  --  4.2  --   NEUTROABS 3.7 3.8 3.7  --   --   --   HGB 7.2* 7.3* 8.9* 10.2* 8.7* 10.2*  HCT 23.0* 23.6* 27.9* 30.0* 27.1* 31.4*  MCV 74.5* 71.2* 76.0*  --  73.0*  --   PLT 369 391 333  --  367  --    Assessment/Plan 1. Stage 4 lung cancer, right -with metastatic disease  -has pain in ribs, liver area -pain not well controlled at present -will schedule his morphine and increase oxycodone to 64m  2. 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 Protein-calorie malnutrition, severe -cont supplements, assistance with meals,   4. COPD bronchitis -cont albuterol--may need to change to nebs to get any benefit at this point with his cognitive status  Family/ staff Communication: discussed with his wife and seen with unit supervisor  Goals of care: full code, rehab with goal to return home, but prognosis poor

## 2014-01-21 ENCOUNTER — Encounter: Payer: Self-pay | Admitting: Radiation Oncology

## 2014-01-21 ENCOUNTER — Ambulatory Visit
Admit: 2014-01-21 | Discharge: 2014-01-21 | Disposition: A | Discharge status: Home / Self Care | Payer: Medicare Other | Attending: Radiation Oncology | Admitting: Radiation Oncology

## 2014-01-21 VITALS — BP 171/88 | HR 60 | Temp 97.5°F | Resp 20

## 2014-01-21 DIAGNOSIS — C7931 Secondary malignant neoplasm of brain: Secondary | ICD-10-CM

## 2014-01-21 DIAGNOSIS — Z510 Encounter for antineoplastic radiation therapy: Secondary | ICD-10-CM | POA: Diagnosis not present

## 2014-01-21 MED ORDER — MORPHINE SULFATE 4 MG/ML IJ SOLN
2.0000 mg | Freq: Once | INTRAMUSCULAR | Status: AC
  Administered 2014-01-21: 2 mg via INTRAMUSCULAR
  Filled 2014-01-21: qty 1

## 2014-01-21 MED ORDER — DEXAMETHASONE 4 MG PO TABS: 4.0000 mg | ORAL_TABLET | ORAL | Status: AC

## 2014-01-21 NOTE — Progress Notes (Signed)
Patient denies having a headache and nausea.  He reports a small amount of dizziness.  He is oriented to person.  He reports his pain has improved after receiving the morphine IM.  His wife is with him.  Patient was given a steroid taper prescription by Dr. Tammi Klippel.  He was escorted in a wheelchair to the lobby by Georgeanna Harrison and [employer].

## 2014-01-21 NOTE — Progress Notes (Signed)
  Radiation Oncology         [phone] ________________________________  Stereotactic Treatment Procedure Note  Name: [name] MRN: [id number]  Date: 01/21/2014  DOB: 1941/04/03  SPECIAL TREATMENT PROCEDURE    ICD-9-CM ICD-10-CM   1. 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  It showed 3-dimensional radiation distributions overlaid onto the planning CT/MRI image set.  The [facility] for the target structures as well as the organs at risk were reviewed. The documentation of the 3D plan and dosimetry are filed in the radiation oncology EMR.  NARRATIVE:  [name] was brought to the TrueBeam stereotactic radiation treatment machine and placed supine on the CT couch. The head frame was applied, and the patient was set up for stereotactic radiosurgery.  Neurosurgery was present for the set-up and delivery  SIMULATION VERIFICATION:  In the couch zero-angle position, the patient underwent Exactrac imaging using the Brainlab system with orthogonal KV images.  These were carefully aligned and repeated to confirm treatment position for each of the isocenters.  The Exactrac snap film verification was repeated at each couch angle.  SPECIAL TREATMENT PROCEDURE: [name] received stereotactic radiosurgery to the following targets: Right frontal 29 mm target was treated using 4 Dynamic Conformal Arcs to a prescription dose of 18 Gy.  ExacTrac registration was performed for each couch angle.  The 80.8% isodose line was prescribed.  STEREOTACTIC TREATMENT MANAGEMENT:  Following delivery, the patient was transported to nursing in stable condition and monitored for  possible acute effects.  Vital signs were recorded BP 152/71  Pulse 57. The patient tolerated treatment without significant acute effects, and was discharged to home in stable condition.    PLAN: Follow-up in one month.  ________________________________  Sheral Apley. Tammi Klippel, M.D.

## 2014-01-21 NOTE — Op Note (Signed)
Name: [name]    MRN: [id number]   Date: 01/21/2014    DOB: 02-02-1941   STEREOTACTIC RADIOSURGERY OPERATIVE NOTE  PRE-OPERATIVE DIAGNOSIS:  Right parietal metastatic tumor  POST-OPERATIVE DIAGNOSIS:  Same  PROCEDURE:  Stereotactic Radiosurgery  SURGEON:  Consuella Lose, MD  RADIATION ONCOLOGIST: Dr. Tyler Pita, MD  TECHNIQUE:  The patient underwent a radiation treatment planning session in the radiation oncology simulation suite under the care of the radiation oncology physician and physicist.  I participated closely in the radiation treatment planning afterwards. The patient underwent planning CT which was fused to 3T high resolution MRI with 1 mm axial slices.  These images were fused on the planning system.  We contoured the gross target volumes and subsequently expanded this to yield the Planning Target Volume. I actively participated in the planning process.  I helped to define and review the target contours and also the contours of the optic pathway, eyes, brainstem and selected nearby organs at risk.  All the dose constraints for critical structures were reviewed and compared to AAPM Task Group 101.  The prescription dose conformity was reviewed.  I approved the plan electronically.    Accordingly, [name]  was brought to the TrueBeam stereotactic radiation treatment linac and placed in the custom immobilization mask.  The patient was aligned according to the IR fiducial markers with BrainLab Exactrac, then orthogonal x-rays were used in ExacTrac with the 6DOF robotic table and the shifts were made to align the patient  [name] received stereotactic radiosurgery to a prescription dose of 18Gy uneventfully.    The detailed description of the procedure is recorded in the radiation oncology procedure note.  I was present for the duration of the procedure.  DISPOSITION:   Following delivery, the patient was transported to nursing in stable condition and  monitored for possible acute effects to be discharged to home in stable condition with follow-up in one month.  Consuella Lose, MD [facility]

## 2014-01-22 DIAGNOSIS — Z510 Encounter for antineoplastic radiation therapy: Secondary | ICD-10-CM | POA: Diagnosis not present

## 2014-01-23 NOTE — Progress Notes (Signed)
Patient waiting with wife in dressing room for [facility] treatment. Patient reports pain. Confirmed with Blanch Media, RN at [facility] the patient had morphine 30 mg today at 0900 then, oxycodone 15 mg at 1245. Informed her this nurse would be administering morphine 2 mg IM as directed by Dr. Tammi Klippel. Blanch Media, RN verbalized understanding.

## 2014-01-23 NOTE — Addendum Note (Signed)
Encounter addended by: Heywood Footman, RN on: 01/23/2014 11:29 AM<BR>     Documentation filed: Notes Section

## 2014-01-24 NOTE — Progress Notes (Signed)
  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 ExacTrac registration was performed for each couch angle. The 80.8% isodose line was prescribed.  6 MV X-rays were delivered in the flattening filter free mode.  Narrative: The patient tolerated radiation treatment relatively well.   No acute complications occurred.  Plan: The patient has completed radiation treatment. The patient will return to radiation oncology clinic for routine followup in one month. I advised them to call or return sooner if they have any questions or concerns related to their recovery or treatment. ________________________________  Sheral Apley. Tammi Klippel, M.D.

## 2014-02-10 ENCOUNTER — Encounter: Payer: Self-pay | Admitting: Internal Medicine

## 2014-02-10 ENCOUNTER — Other Ambulatory Visit: Payer: Self-pay | Admitting: *Deleted

## 2014-02-10 ENCOUNTER — Non-Acute Institutional Stay (SKILLED_NURSING_FACILITY): Payer: Medicare Other | Admitting: Internal Medicine

## 2014-02-10 DIAGNOSIS — E43 Unspecified severe protein-calorie malnutrition: Secondary | ICD-10-CM

## 2014-02-10 DIAGNOSIS — C3491 Malignant neoplasm of unspecified part of right bronchus or lung: Secondary | ICD-10-CM

## 2014-02-10 DIAGNOSIS — C7931 Secondary malignant neoplasm of brain: Secondary | ICD-10-CM

## 2014-02-10 DIAGNOSIS — R410 Disorientation, unspecified: Secondary | ICD-10-CM

## 2014-02-10 DIAGNOSIS — J449 Chronic obstructive pulmonary disease, unspecified: Secondary | ICD-10-CM

## 2014-02-10 DIAGNOSIS — IMO0001 Reserved for inherently not codable concepts without codable children: Secondary | ICD-10-CM

## 2014-02-10 DIAGNOSIS — F05 Delirium due to known physiological condition: Secondary | ICD-10-CM

## 2014-02-10 MED ORDER — MORPHINE SULFATE 30 MG PO TABS: ORAL_TABLET | ORAL | Status: AC

## 2014-02-10 NOTE — Progress Notes (Signed)
Patient ID: [name], male   DOB: October 16, 1940, 73 y.o.   MRN: [id number]  Location:  [facility] SNF Provider:  Rexene Edison. Mariea Clonts, D.O., C.M.D.  Code Status:  Full code  Chief Complaint  Patient presents with  . Acute Visit  . Altered Mental Status    anxious and hitting things, cannot be calmed down;  also insurance requested a peer to peer about why he needs skilled care    HPI:  Cachectic black male 73 yo here for "rehab" so he can get stronger for chemotherapy.  He had been hospitalized after he had a stroke due to a metastatic brain lesion from his stage IV lung cancer.  His wife believes that b/c of hospice care, his "stroke" was not caught soon enough.  She says he wrote down that he wanted to be kept alive by whatever means possible.  We discussed during the last care plan discussion that there may come a time when she feels his quality of life is too poor to follow those wishes and following them might cause him more harm than good.  We discussed his pain and confusion and that his cancer continues to progress.  She says she will let us know if she changes her mind.    I also spoke with the physician at his insurance who also noted his poor prognosis and his lack of "skilled needs" aside from being dependent in ADLs, he is not able to participate in therapy.  They have agreed to "skill him for end of life care".  His wife has applied for medicaid so he could be long term if he should survive.    He is incredibly confused, agitated when Delcie Roch is not present and hitting the bedrails.   Review of Systems:  Review of Systems  Constitutional: Positive for weight loss and malaise/fatigue. Negative for fever.  HENT: Negative for congestion.   Respiratory: Negative for shortness of breath.   Cardiovascular: Positive for chest pain.  Gastrointestinal: Negative for abdominal pain.  Genitourinary: Negative for dysuria.  Musculoskeletal: Positive for myalgias. Negative for  falls.  Neurological: Positive for weakness. Negative for dizziness.  Psychiatric/Behavioral: Positive for hallucinations and memory loss. The patient is nervous/anxious and has insomnia.     Medications: Patient's Medications  New Prescriptions   No medications on file  Previous Medications   ALBUTEROL (PROVENTIL HFA;VENTOLIN HFA) 108 (90 BASE) MCG/ACT INHALER    Inhale 2 puffs into the lungs every 6 (six) hours as needed for wheezing or shortness of breath.   CARVEDILOL (COREG) 3.125 MG TABLET    Take 1 tablet (3.125 mg total) by mouth 2 (two) times daily with a meal.   DEXAMETHASONE (DECADRON) 4 MG TABLET    Take 1 tablet (4 mg total) by mouth as directed. 4 mg four times daily for 1 week, then 4 mg three times daily for 1 week, then 4 mg twice daily for 1 week, then 2 mg twice daily for 1 week, then 2 mg once daily for 1 week, then stop   DOCUSATE SODIUM (COLACE) 100 MG CAPSULE    Take 100 mg by mouth daily.    ESCITALOPRAM (LEXAPRO) 10 MG TABLET    Take 10 mg by mouth daily.   FUROSEMIDE (LASIX) 20 MG TABLET    Take 20 mg by mouth daily after breakfast.    LEVETIRACETAM (KEPPRA) 500 MG TABLET    Take 1 tablet (500 mg total) by mouth 2 (two) times daily.  MORPHINE (MSIR) 30 MG TABLET    Take one tablet by mouth every 12 hours for pain   NYSTATIN (MYCOSTATIN) 100000 UNIT/ML SUSPENSION    Take 5 mLs by mouth 4 (four) times daily.   ONDANSETRON (ZOFRAN) 8 MG TABLET    Take 8 mg by mouth 2 (two) times daily as needed for nausea or vomiting.    OXYCODONE (ROXICODONE) 15 MG IMMEDIATE RELEASE TABLET    Take 1 tablet (15 mg total) by mouth every 4 (four) hours as needed.   PANTOPRAZOLE (PROTONIX) 40 MG TABLET    Take 1 tablet (40 mg total) by mouth daily.   POLYETHYLENE GLYCOL (MIRALAX / GLYCOLAX) PACKET    Take 17 g by mouth daily as needed for moderate constipation.    SENNA (SENOKOT) 8.6 MG TABS TABLET    Take 1 tablet by mouth 2 (two) times daily.    SERTRALINE (ZOLOFT) 25 MG TABLET     Take 25 mg by mouth daily with breakfast.   Modified Medications   No medications on file  Discontinued Medications   No medications on file    Physical Exam: Filed Vitals:   02/10/14 1211  BP: 127/66  Pulse: 78  Temp: 98 F (36.7 C)  Resp: 18  Height: 5\' 10"  (1.778 m)  Weight: 125 lb (56.7 kg)  SpO2: 93%  Physical Exam  Constitutional:  Cachectic black male resting in bed, cannot get comfortable position--moving around  Cardiovascular: Normal rate, regular rhythm and normal heart sounds.   Pulmonary/Chest: Effort normal.  Wearing O2, coarse rhonchi throughout  Musculoskeletal: Normal range of motion.  Neurological: He is alert.  Very confused, unable to give straight answers to questions  Skin: Skin is warm and dry.  No areas of skin breakdown    Labs reviewed: Basic Metabolic Panel:  Recent Labs  01/02/14 0824 01/06/14 1110 01/06/14 1123 01/07/14 0530  NA 141 138 139 136*  K 3.5 3.7 3.6* 3.9  CL  --  97 98 96  CO2 34* 30  --  27  GLUCOSE 120 96 100* 149*  BUN 13.2 12 11 21   CREATININE 1.2 1.03 1.00 1.07  CALCIUM 9.3 8.9  --  9.2    Liver Function Tests:  Recent Labs  12/24/13 1303 01/02/14 0824 01/06/14 1110  AST 24 20 19   ALT 9 6 8   ALKPHOS 63 64 60  BILITOT 0.20 0.32 0.3  PROT 8.9* 8.8* 8.6*  ALBUMIN 2.2* 2.1* 2.2*    CBC:  Recent Labs  12/24/13 1303 01/02/14 0824 01/06/14 1110 01/06/14 1123 01/07/14 0530 01/08/14 2013  WBC 6.1 5.8 5.7  --  4.2  --   NEUTROABS 3.7 3.8 3.7  --   --   --   HGB 7.2* 7.3* 8.9* 10.2* 8.7* 10.2*  HCT 23.0* 23.6* 27.9* 30.0* 27.1* 31.4*  MCV 74.5* 71.2* 76.0*  --  73.0*  --   PLT 369 391 333  --  367  --     Assessment/Plan 1. Subacute delirium -due to terminal cancer with narcotics, recent stroke, metastatic disease to brain, steroids -due to agitated state, will add low dose seroquel to help make him more comfortable  2. Stage 4 lung cancer, right -unfortunately, his family was reassured by  oncology that his life could be prolonged despite it's poor quality at this time  -hospice is recommended, but not accepted at this time as family blames them for his metastasis causing his stroke  3. COPD bronchitis -continues on oxygen therapy,  albuterol as needed  4. 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 Protein-calorie malnutrition, severe -intake poor, appetite poor, extremely cachectic, receiving supplements, but continues to decline  Family/ staff Communication: seen with DNS, see hpi  Goals of care: remains full code based on his prior directives (not official documents)  Labs/tests ordered:  none

## 2014-02-10 NOTE — Telephone Encounter (Signed)
[facility] 

## 2014-02-11 ENCOUNTER — Encounter (HOSPITAL_COMMUNITY): Payer: Self-pay

## 2014-02-11 ENCOUNTER — Emergency Department (HOSPITAL_COMMUNITY): Payer: Non-veteran care

## 2014-02-11 ENCOUNTER — Inpatient Hospital Stay (HOSPITAL_COMMUNITY)
Admission: EM | Admit: 2014-02-11 | Discharge: 2014-03-10 | DRG: 193 | Disposition: E | Payer: Non-veteran care | Attending: Internal Medicine | Admitting: Internal Medicine

## 2014-02-11 DIAGNOSIS — E785 Hyperlipidemia, unspecified: Secondary | ICD-10-CM | POA: Insufficient documentation

## 2014-02-11 DIAGNOSIS — N179 Acute kidney failure, unspecified: Secondary | ICD-10-CM

## 2014-02-11 DIAGNOSIS — J189 Pneumonia, unspecified organism: Principal | ICD-10-CM

## 2014-02-11 DIAGNOSIS — C7931 Secondary malignant neoplasm of brain: Secondary | ICD-10-CM | POA: Insufficient documentation

## 2014-02-11 DIAGNOSIS — R0602 Shortness of breath: Secondary | ICD-10-CM

## 2014-02-11 DIAGNOSIS — Z87891 Personal history of nicotine dependence: Secondary | ICD-10-CM | POA: Diagnosis not present

## 2014-02-11 DIAGNOSIS — Z7189 Other specified counseling: Secondary | ICD-10-CM

## 2014-02-11 DIAGNOSIS — R64 Cachexia: Secondary | ICD-10-CM | POA: Diagnosis present

## 2014-02-11 DIAGNOSIS — E860 Dehydration: Secondary | ICD-10-CM | POA: Diagnosis present

## 2014-02-11 DIAGNOSIS — Z681 Body mass index (BMI) 19 or less, adult: Secondary | ICD-10-CM

## 2014-02-11 DIAGNOSIS — J9601 Acute respiratory failure with hypoxia: Secondary | ICD-10-CM | POA: Diagnosis present

## 2014-02-11 DIAGNOSIS — J9600 Acute respiratory failure, unspecified whether with hypoxia or hypercapnia: Secondary | ICD-10-CM | POA: Diagnosis present

## 2014-02-11 DIAGNOSIS — H409 Unspecified glaucoma: Secondary | ICD-10-CM | POA: Diagnosis present

## 2014-02-11 DIAGNOSIS — Z9221 Personal history of antineoplastic chemotherapy: Secondary | ICD-10-CM

## 2014-02-11 DIAGNOSIS — Z66 Do not resuscitate: Secondary | ICD-10-CM | POA: Diagnosis present

## 2014-02-11 DIAGNOSIS — N182 Chronic kidney disease, stage 2 (mild): Secondary | ICD-10-CM | POA: Diagnosis present

## 2014-02-11 DIAGNOSIS — E43 Unspecified severe protein-calorie malnutrition: Secondary | ICD-10-CM | POA: Diagnosis present

## 2014-02-11 DIAGNOSIS — R4182 Altered mental status, unspecified: Secondary | ICD-10-CM | POA: Diagnosis present

## 2014-02-11 DIAGNOSIS — E46 Unspecified protein-calorie malnutrition: Secondary | ICD-10-CM | POA: Insufficient documentation

## 2014-02-11 DIAGNOSIS — D638 Anemia in other chronic diseases classified elsewhere: Secondary | ICD-10-CM | POA: Diagnosis present

## 2014-02-11 DIAGNOSIS — Z515 Encounter for palliative care: Secondary | ICD-10-CM

## 2014-02-11 DIAGNOSIS — Z79899 Other long term (current) drug therapy: Secondary | ICD-10-CM

## 2014-02-11 DIAGNOSIS — G936 Cerebral edema: Secondary | ICD-10-CM | POA: Diagnosis present

## 2014-02-11 DIAGNOSIS — I10 Essential (primary) hypertension: Secondary | ICD-10-CM | POA: Insufficient documentation

## 2014-02-11 DIAGNOSIS — R4020 Unspecified coma: Secondary | ICD-10-CM | POA: Diagnosis present

## 2014-02-11 DIAGNOSIS — C3490 Malignant neoplasm of unspecified part of unspecified bronchus or lung: Secondary | ICD-10-CM | POA: Diagnosis present

## 2014-02-11 DIAGNOSIS — R7989 Other specified abnormal findings of blood chemistry: Secondary | ICD-10-CM

## 2014-02-11 DIAGNOSIS — Y95 Nosocomial condition: Secondary | ICD-10-CM | POA: Diagnosis present

## 2014-02-11 DIAGNOSIS — R5383 Other fatigue: Secondary | ICD-10-CM | POA: Diagnosis present

## 2014-02-11 DIAGNOSIS — R627 Adult failure to thrive: Secondary | ICD-10-CM | POA: Diagnosis present

## 2014-02-11 DIAGNOSIS — I129 Hypertensive chronic kidney disease with stage 1 through stage 4 chronic kidney disease, or unspecified chronic kidney disease: Secondary | ICD-10-CM | POA: Diagnosis present

## 2014-02-11 LAB — URINALYSIS, ROUTINE W REFLEX MICROSCOPIC
Bilirubin Urine: NEGATIVE
Glucose, UA: NEGATIVE mg/dL
KETONES UR: NEGATIVE mg/dL
LEUKOCYTES UA: NEGATIVE
Nitrite: NEGATIVE
PH: 5.5 (ref 5.0–8.0)
Protein, ur: NEGATIVE mg/dL
Specific Gravity, Urine: 1.015 (ref 1.005–1.030)
Urobilinogen, UA: 1 mg/dL (ref 0.0–1.0)

## 2014-02-11 LAB — CBC WITH DIFFERENTIAL/PLATELET
BASOS ABS: 0 10*3/uL (ref 0.0–0.1)
Basophils Relative: 0 % (ref 0–1)
Eosinophils Absolute: 0 10*3/uL (ref 0.0–0.7)
Eosinophils Relative: 0 % (ref 0–5)
HCT: 25.1 % — ABNORMAL LOW (ref 39.0–52.0)
Hemoglobin: 7.7 g/dL — ABNORMAL LOW (ref 13.0–17.0)
Lymphocytes Relative: 7 % — ABNORMAL LOW (ref 12–46)
Lymphs Abs: 0.5 10*3/uL — ABNORMAL LOW (ref 0.7–4.0)
MCH: 23.2 pg — ABNORMAL LOW (ref 26.0–34.0)
MCHC: 30.7 g/dL (ref 30.0–36.0)
MCV: 75.6 fL — AB (ref 78.0–100.0)
MONOS PCT: 6 % (ref 3–12)
Monocytes Absolute: 0.5 10*3/uL (ref 0.1–1.0)
Neutro Abs: 6.7 10*3/uL (ref 1.7–7.7)
Neutrophils Relative %: 87 % — ABNORMAL HIGH (ref 43–77)
Platelets: 189 10*3/uL (ref 150–400)
RBC: 3.32 MIL/uL — ABNORMAL LOW (ref 4.22–5.81)
RDW: 23.2 % — AB (ref 11.5–15.5)
WBC: 7.7 10*3/uL (ref 4.0–10.5)

## 2014-02-11 LAB — COMPREHENSIVE METABOLIC PANEL
ALT: 40 U/L (ref 0–53)
ANION GAP: 16 — AB (ref 5–15)
AST: 66 U/L — ABNORMAL HIGH (ref 0–37)
Albumin: 2.6 g/dL — ABNORMAL LOW (ref 3.5–5.2)
Alkaline Phosphatase: 109 U/L (ref 39–117)
BUN: 36 mg/dL — AB (ref 6–23)
CO2: 25 mEq/L (ref 19–32)
Calcium: 9.5 mg/dL (ref 8.4–10.5)
Chloride: 104 mEq/L (ref 96–112)
Creatinine, Ser: 1.49 mg/dL — ABNORMAL HIGH (ref 0.50–1.35)
GFR calc Af Amer: 52 mL/min — ABNORMAL LOW (ref >90)
GFR calc non Af Amer: 45 mL/min — ABNORMAL LOW (ref >90)
GLUCOSE: 148 mg/dL — AB (ref 70–99)
Potassium: 4.8 mEq/L (ref 3.7–5.3)
Sodium: 145 mEq/L (ref 137–147)
TOTAL PROTEIN: 8.6 g/dL — AB (ref 6.0–8.3)
Total Bilirubin: 0.7 mg/dL (ref 0.3–1.2)

## 2014-02-11 LAB — I-STAT CG4 LACTIC ACID, ED
LACTIC ACID, VENOUS: 8.46 mmol/L — AB (ref 0.5–2.2)
Lactic Acid, Venous: 5.06 mmol/L — ABNORMAL HIGH (ref 0.5–2.2)

## 2014-02-11 LAB — URINE MICROSCOPIC-ADD ON

## 2014-02-11 LAB — MRSA PCR SCREENING: MRSA BY PCR: POSITIVE — AB

## 2014-02-11 LAB — TROPONIN I: Troponin I: <0.3 ng/mL (ref <0.30)

## 2014-02-11 LAB — PRO B NATRIURETIC PEPTIDE: Pro B Natriuretic peptide (BNP): 611.9 pg/mL — ABNORMAL HIGH (ref 0–125)

## 2014-02-11 MED ORDER — DEXAMETHASONE 4 MG PO TABS
4.0000 mg | ORAL_TABLET | Freq: Two times a day (BID) | ORAL | Status: DC
  Filled 2014-02-11 (×3): qty 1

## 2014-02-11 MED ORDER — DEXTROSE 5 % IV SOLN
1.0000 g | INTRAVENOUS | Status: DC
  Filled 2014-02-11: qty 1

## 2014-02-11 MED ORDER — CEFEPIME HCL 1 G IJ SOLR: 1.0000 g | Freq: Three times a day (TID) | INTRAMUSCULAR | Status: DC

## 2014-02-11 MED ORDER — SODIUM CHLORIDE 0.9 % IV BOLUS (SEPSIS): 500.0000 mL | Freq: Once | INTRAVENOUS | Status: DC

## 2014-02-11 MED ORDER — DEXTROSE 5 % IV SOLN
2.0000 g | Freq: Once | INTRAVENOUS | Status: AC
  Administered 2014-02-11: 2 g via INTRAVENOUS
  Filled 2014-02-11: qty 2

## 2014-02-11 MED ORDER — LORAZEPAM 2 MG/ML IJ SOLN
INTRAMUSCULAR | Status: AC
  Filled 2014-02-11: qty 1

## 2014-02-11 MED ORDER — PANTOPRAZOLE SODIUM 40 MG PO TBEC: 40.0000 mg | DELAYED_RELEASE_TABLET | Freq: Every day | ORAL | Status: DC

## 2014-02-11 MED ORDER — ALBUTEROL SULFATE (2.5 MG/3ML) 0.083% IN NEBU: 2.5000 mg | INHALATION_SOLUTION | RESPIRATORY_TRACT | Status: DC | PRN

## 2014-02-11 MED ORDER — SODIUM CHLORIDE 0.9 % IV BOLUS (SEPSIS)
1000.0000 mL | INTRAVENOUS | Status: AC
Start: 2014-02-11 — End: 2014-02-11
  Administered 2014-02-11: 1000 mL via INTRAVENOUS

## 2014-02-11 MED ORDER — MORPHINE SULFATE 2 MG/ML IJ SOLN
2.0000 mg | Freq: Once | INTRAMUSCULAR | Status: AC
Start: 2014-02-11 — End: 2014-02-11
  Administered 2014-02-11: 2 mg via INTRAVENOUS

## 2014-02-11 MED ORDER — LEVETIRACETAM 500 MG PO TABS
500.0000 mg | ORAL_TABLET | Freq: Two times a day (BID) | ORAL | Status: DC
  Filled 2014-02-11 (×3): qty 1

## 2014-02-11 MED ORDER — QUETIAPINE FUMARATE 25 MG PO TABS
25.0000 mg | ORAL_TABLET | Freq: Every day | ORAL | Status: DC
  Filled 2014-02-11 (×2): qty 1

## 2014-02-11 MED ORDER — DEXAMETHASONE 4 MG PO TABS: 4.0000 mg | ORAL_TABLET | ORAL | Status: DC

## 2014-02-11 MED ORDER — VANCOMYCIN HCL IN DEXTROSE 750-5 MG/150ML-% IV SOLN
750.0000 mg | INTRAVENOUS | Status: DC
  Filled 2014-02-11: qty 150

## 2014-02-11 MED ORDER — VANCOMYCIN HCL IN DEXTROSE 1-5 GM/200ML-% IV SOLN
1000.0000 mg | Freq: Once | INTRAVENOUS | Status: AC
  Administered 2014-02-11: 1000 mg via INTRAVENOUS
  Filled 2014-02-11: qty 200

## 2014-02-11 MED ORDER — ADULT MULTIVITAMIN W/MINERALS CH
1.0000 | ORAL_TABLET | Freq: Two times a day (BID) | ORAL | Status: DC
  Filled 2014-02-11 (×3): qty 1

## 2014-02-11 MED ORDER — LEVETIRACETAM IN NACL 500 MG/100ML IV SOLN
500.0000 mg | Freq: Once | INTRAVENOUS | Status: AC
  Administered 2014-02-11: 500 mg via INTRAVENOUS
  Filled 2014-02-11: qty 100

## 2014-02-11 MED ORDER — DEXAMETHASONE SODIUM PHOSPHATE 4 MG/ML IJ SOLN
4.0000 mg | Freq: Once | INTRAMUSCULAR | Status: AC
  Administered 2014-02-11: 4 mg via INTRAVENOUS
  Filled 2014-02-11: qty 1

## 2014-02-11 MED ORDER — DEXAMETHASONE 2 MG PO TABS: 2.0000 mg | ORAL_TABLET | Freq: Every day | ORAL | Status: DC

## 2014-02-11 MED ORDER — DEXAMETHASONE 2 MG PO TABS: 2.0000 mg | ORAL_TABLET | Freq: Two times a day (BID) | ORAL | Status: DC

## 2014-02-11 MED ORDER — LORAZEPAM 2 MG/ML IJ SOLN
0.5000 mg | Freq: Once | INTRAMUSCULAR | Status: AC
  Administered 2014-02-11: 0.5 mg via INTRAVENOUS

## 2014-02-11 MED ORDER — SODIUM CHLORIDE 0.9 % IV SOLN
INTRAVENOUS | Status: AC
  Administered 2014-02-11 – 2014-02-12 (×2): via INTRAVENOUS

## 2014-02-11 MED ORDER — MORPHINE SULFATE 2 MG/ML IJ SOLN
1.0000 mg | INTRAMUSCULAR | Status: DC | PRN
  Administered 2014-02-11 – 2014-02-12 (×4): 1 mg via INTRAVENOUS
  Filled 2014-02-11 (×6): qty 1

## 2014-02-11 NOTE — ED Notes (Signed)
Patient transported to CT 

## 2014-02-11 NOTE — Progress Notes (Addendum)
ANTIBIOTIC CONSULT NOTE - INITIAL  Pharmacy Consult for vancomycin Indication: rule out pneumonia  No Known Allergies  Patient Measurements: Height: 5' 10"  (177.8 cm) Weight: 117 lb 1 oz (53.1 kg) IBW/kg (Calculated) : 73   Vital Signs: Temp: 98.4 F (36.9 C) (11/04 1919) Temp Source: Axillary (11/04 1919) BP: 115/73 mmHg (11/04 1919) Pulse Rate: 85 (11/04 1830) Intake/Output from previous day:   Intake/Output from this shift:    Labs:  Recent Labs  02/19/2014 1245 02/27/2014 1520  WBC 7.7  --   HGB 7.7*  --   PLT 189  --   CREATININE  --  1.49*   Estimated Creatinine Clearance: 33.7 mL/min (by C-G formula based on Cr of 1.49). No results for input(s): VANCOTROUGH, VANCOPEAK, VANCORANDOM, GENTTROUGH, GENTPEAK, GENTRANDOM, TOBRATROUGH, TOBRAPEAK, TOBRARND, AMIKACINPEAK, AMIKACINTROU, AMIKACIN in the last 72 hours.   Microbiology: No results found for this or any previous visit (from the past 720 hour(s)).  Medical History: Past Medical History  Diagnosis Date  . Bronchitis   . Glaucoma   . Heart disease   . Hyperlipidemia   . Hypertension   . CAD (coronary artery disease)     Remote cath ?1998.   . Tobacco abuse   . Glaucoma (increased eye pressure)   . 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 S/P chemotherapy, time since greater than 12 weeks     Assessment: 32 YOM with stage IV lung cancer with brain mets brought here from [facility] with respiratory distress.  ?PNA vs worsening mets. SCr 1.49 with est CrCL ~27m/min. Got cefepime 2g and vancomycin 1g in the ED at 1328 and 1453, respectively. Will enter an 8 day course of antibiotics.  Goal of Therapy:  Vancomycin trough level 15-20 mcg/ml  Plan:  1. Change cefepime to 1g IV q24h starting tomorrow 2. Vancomycin 7573mIV q24h starting tomorrow  3. Follow c/s, clinical progression, renal function, [place] [facility]. Tamberly Pomplun, PharmD, BCPS Clinical Pharmacist Pager:  [phone]/07/2013 8:30 PM

## 2014-02-11 NOTE — Progress Notes (Signed)
Pt transferred from ED with RN on monitor. PT vss, and pt seems comfortable. Condom cath placed. Family at bedside.

## 2014-02-11 NOTE — ED Notes (Signed)
Phlebotomy at the bedside  

## 2014-02-11 NOTE — ED Notes (Signed)
Aline Brochure, MD notified of abnormal lab test results

## 2014-02-11 NOTE — ED Notes (Signed)
Lab called and stated that blood was hemolyzed. CMP reordered.

## 2014-02-11 NOTE — Progress Notes (Signed)
RT Note- NTS patient, obtaining a moderate amount of thick tan bloody secretions.  Patient also deep oral suctioned with a small amount of thick bloody secretions.  Patient remains on NRB, HR 97, RR 28 spo2 95%.

## 2014-02-11 NOTE — Consult Note (Signed)
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  Presented 11/4 after being found slumped over at SNF with labored respirations.  ?PNA v worsening metastases.  Respiratory status was tenuous and PCCM called to admit.   STUDIES:  CT head 11/4>>>Mass in the right parietal lobe has become larger with an increase in hemorrhage compared to recent prior studies. There is moderate vasogenic edema in this region.  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  Presented 11/4 after being found slumped over at SNF with labored respirations.  ?PNA v worsening metastases.   Respiratory status was tenuous and PCCM called to admit.   PAST MEDICAL HISTORY :  Past Medical History  Diagnosis Date  . Bronchitis   . Glaucoma   . Heart disease   . Hyperlipidemia   . Hypertension   . CAD (coronary artery disease)     Remote cath ?1998.   . Tobacco abuse   . Glaucoma (increased eye pressure)   . 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 S/P chemotherapy, time since greater than 12 weeks      has past surgical history that includes None and Cardiac catheterization. Prior to Admission medications   Medication Sig Start Date End Date Taking? Authorizing Provider  albuterol (PROVENTIL HFA;VENTOLIN HFA) 108 (90 BASE) MCG/ACT inhaler Inhale 2 puffs into the lungs every 6 (six) hours as needed for wheezing or shortness of breath. 08/19/13  Yes Domenic Polite, MD  carvedilol  (COREG) 3.125 MG tablet Take 1 tablet (3.125 mg total) by mouth 2 (two) times daily with a meal. 01/08/14  Yes Belkys A Regalado, MD  dexamethasone (DECADRON) 4 MG tablet Take 1 tablet (4 mg total) by mouth as directed. 4 mg four times daily for 1 week, then 4 mg three times daily for 1 week, then 4 mg twice daily for 1 week, then 2 mg twice daily for 1 week, then 2 mg once daily for 1 week, then stop 01/21/14  Yes Lora Paula, MD  docusate sodium (COLACE) 100 MG capsule Take 100 mg by mouth daily.    Yes Historical Provider, MD  escitalopram (LEXAPRO) 10 MG tablet Take 10 mg by mouth daily.   Yes Historical Provider, MD  furosemide (LASIX) 20 MG tablet Take 20 mg by mouth daily after breakfast.    Yes Historical Provider, MD  levETIRAcetam (KEPPRA) 500 MG tablet Take 1 tablet (500 mg total) by mouth 2 (two) times daily. 01/08/14  Yes Belkys A Regalado, MD  morphine (MSIR) 30 MG tablet Take one tablet by mouth every 12 hours for pain Patient taking differently: Take 30 mg by mouth every 12 (twelve) hours.  02/10/14  Yes Mahima Bubba Camp, MD  Multiple Vitamin (MULTIVITAMIN WITH MINERALS) TABS tablet Take 1 tablet by mouth 2 (two) times daily.   Yes Historical Provider, MD  nystatin (MYCOSTATIN) 100000 UNIT/ML suspension Take 5 mLs by mouth 4 (four) times daily. For thrush. Swish  and swallow.   Yes Historical Provider, MD  Oxycodone HCl 10 MG TABS Take 10 mg by mouth every 4 (four) hours as needed (as needed for breakthrough pain).   Yes Historical Provider, MD  pantoprazole (PROTONIX) 40 MG tablet Take 1 tablet (40 mg total) by mouth daily. 01/08/14  Yes Belkys A Regalado, MD  QUEtiapine (SEROQUEL) 25 MG tablet Take 25 mg by mouth at bedtime.   Yes Historical Provider, MD  senna (SENOKOT) 8.6 MG TABS tablet Take 1 tablet by mouth 2 (two) times daily.    Yes Historical Provider, MD  polyethylene glycol (MIRALAX / GLYCOLAX) packet Take 17 g by mouth daily as needed for moderate constipation.      Historical Provider, MD   No Known Allergies  FAMILY HISTORY:  has no family status information on file.  SOCIAL HISTORY:  reports that he has quit smoking. He has quit using smokeless tobacco. He reports that he does not drink alcohol or use illicit drugs.  REVIEW OF SYSTEMS:   Unable, pt lethargic.  Per HPI.   SUBJECTIVE:   VITAL SIGNS: Temp:  [97.1 F (36.2 C)] 97.1 F (36.2 C) (11/04 1500) Pulse Rate:  [179] 179 (11/04 1141) Resp:  [25-32] 25 (11/04 1530) BP: (103-151)/(62-130) 109/72 mmHg (11/04 1530) SpO2:  [98 %-100 %] 98 % (11/04 1332) FiO2 (%):  [50 %] 50 % (11/04 1332) HEMODYNAMICS:   VENTILATOR SETTINGS: Vent Mode:  [-]  FiO2 (%):  [50 %] 50 % INTAKE / OUTPUT:  Intake/Output Summary (Last 24 hours) at 03/02/2014 1613 Last data filed at 02/13/2014 1504  Gross per 24 hour  Intake      0 ml  Output    100 ml  Net   -100 ml    PHYSICAL EXAMINATION: General:  Frail, cachectic male, NAD  Neuro:  Somnolent, arouses to loud voice HEENT:  Mm dry, no JVD, gurgling breath sounds  Cardiovascular:  s1s2 rrr Lungs:  resps even, shallow, mildly labored, gurgling breath sounds Abdomen:  Soft, +bs Musculoskeletal:  Warm and dry, no edema   LABS:  CBC  Recent Labs Lab 02/21/2014 1245  WBC 7.7  HGB 7.7*  HCT 25.1*  PLT 189   Coag's No results for input(s): APTT, INR in the last 168 hours. BMET No results for input(s): NA, K, CL, CO2, BUN, CREATININE, GLUCOSE in the last 168 hours. Electrolytes No results for input(s): CALCIUM, MG, PHOS in the last 168 hours. Sepsis Markers  Recent Labs Lab 02/20/2014 1314 02/10/2014 1533  LATICACIDVEN 8.46* 5.06*   ABG No results for input(s): PHART, PCO2ART, PO2ART in the last 168 hours. Liver Enzymes No results for input(s): AST, ALT, ALKPHOS, BILITOT, ALBUMIN in the last 168 hours. Cardiac Enzymes  Recent Labs Lab 02/22/2014 1245 02/12/2014 1307  TROPONINI <0.30  --   PROBNP  --  611.9*   Glucose No results for  input(s): GLUCAP in the last 168 hours.  Imaging No results found.   ASSESSMENT / PLAN:  Acute respiratory failure - multifactorial in setting advanced lung ca with likely increased metastases +/- HCAP c/b worsening mental status r/t increasing brain met.  Very poor prognosis overall.  Discussed at length with family (wife and pastor) at the bedside.  He has been declining overall.  They do not want to pursue aggressive care or further invasive interventions.    PLAN -  Admit per [facility]  DNR/DNI  Antibiotics for ?HCAP vs aspiration, recommend vanc/zosyn BD's PRN  Gentle volume  No pressors,  no CVL  If worsens would transition to comfort   PCCM signing off, please call back if needed.      Nickolas Madrid, NP 02/20/2014  4:13 PM Pager: [phone] or [phone]  Spoke with family extensively, patient has a living will and does not wish for aggressive care.  They were agreeable to DNR but would like to treat with abx and fluid.  Patient is clearly aspirating and is more than likely to expire during this admission.  Will make DNR, recommend broad spectrum abx, NPO and IVF.  No pressors, central access, intubation, CPR or cardioversion.  CC time 35 min.  Patient seen and examined, agree with above note.  I dictated the care and orders written for this patient under my direction.  Rush Farmer, MD [phone]

## 2014-02-11 NOTE — ED Notes (Signed)
Dr. Aline Brochure advised that only one set of blood cultures obtained.  DR.  Aline Brochure ok with having only the one set drawn prior to the administration of antibiotics.

## 2014-02-11 NOTE — H&P (Addendum)
[facility] History and Physical  [name] IOX:[id number] DOB: 10/08/1940 DOA: 02/28/2014  Referring physician: er PCP: [name], MD   Chief Complaint: PNA  HPI: [name] is a 73 y.o. male with PMHX of Stage Iv Lung cancer and brain cancer. He had stereotactic radiosurgery 10/14. Brought to ER from [facility].  Records show patient was slumped over in bed.  EMs was called and when they arrived, he had labored breathing.  Facility reports periods of apnea but that was not confirmed by EMS.   In the ER, PCCM saw patient as breathing labored, lactic acid elevated and was initially a full code.  They spoke with family and made patient a DNR due to his overall poor prognosis.   Wife does not want CPR, intubation, pressors or central line.  They decided if patient were to worsen, he would be transitioned to comfort care.   Patient's only complaints are hurting all over and being cold  Review of Systems:  Unable to do full ROS as patient is not able to answer   Past Medical History  Diagnosis Date  . Bronchitis   . Glaucoma   . Heart disease   . Hyperlipidemia   . Hypertension   . CAD (coronary artery disease)     Remote cath ?1998.   . Tobacco abuse   . Glaucoma (increased eye pressure)   . 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 S/P chemotherapy, time since greater than 12 weeks    Past Surgical History  Procedure Laterality Date  . None    . Cardiac catheterization     Social History:  reports that he has quit smoking. He has quit using smokeless tobacco. He reports that he does not drink alcohol or use illicit drugs.  No Known Allergies  Family History  Problem Relation Age of Onset  . Hyperlipidemia Mother   . Heart disease Mother   . Stroke Mother   . Hypertension Mother   . Kidney disease Mother   . Diabetes Mother   . Diabetes Father   . Hypertension Maternal Grandfather   . Hyperlipidemia Paternal  Grandmother   . Diabetes Paternal Grandmother   . Heart attack Maternal Grandfather      Prior to Admission medications   Medication Sig Start Date End Date Taking? Authorizing Provider  albuterol (PROVENTIL HFA;VENTOLIN HFA) 108 (90 BASE) MCG/ACT inhaler Inhale 2 puffs into the lungs every 6 (six) hours as needed for wheezing or shortness of breath. 08/19/13  Yes Domenic Polite, MD  carvedilol (COREG) 3.125 MG tablet Take 1 tablet (3.125 mg total) by mouth 2 (two) times daily with a meal. 01/08/14  Yes Belkys A Regalado, MD  dexamethasone (DECADRON) 4 MG tablet Take 1 tablet (4 mg total) by mouth as directed. 4 mg four times daily for 1 week, then 4 mg three times daily for 1 week, then 4 mg twice daily for 1 week, then 2 mg twice daily for 1 week, then 2 mg once daily for 1 week, then stop 01/21/14  Yes Lora Paula, MD  docusate sodium (COLACE) 100 MG capsule Take 100 mg by mouth daily.    Yes Historical Provider, MD  escitalopram (LEXAPRO) 10 MG tablet Take 10 mg by mouth daily.   Yes Historical Provider, MD  furosemide (LASIX) 20 MG tablet Take 20 mg by mouth daily after breakfast.    Yes Historical Provider, MD  levETIRAcetam (KEPPRA)  500 MG tablet Take 1 tablet (500 mg total) by mouth 2 (two) times daily. 01/08/14  Yes Belkys A Regalado, MD  morphine (MSIR) 30 MG tablet Take one tablet by mouth every 12 hours for pain Patient taking differently: Take 30 mg by mouth every 12 (twelve) hours.  02/10/14  Yes Mahima Bubba Camp, MD  Multiple Vitamin (MULTIVITAMIN WITH MINERALS) TABS tablet Take 1 tablet by mouth 2 (two) times daily.   Yes Historical Provider, MD  nystatin (MYCOSTATIN) 100000 UNIT/ML suspension Take 5 mLs by mouth 4 (four) times daily. For thrush. Swish and swallow.   Yes Historical Provider, MD  Oxycodone HCl 10 MG TABS Take 10 mg by mouth every 4 (four) hours as needed (as needed for breakthrough pain).   Yes Historical Provider, MD  pantoprazole (PROTONIX) 40 MG tablet Take 1  tablet (40 mg total) by mouth daily. 01/08/14  Yes Belkys A Regalado, MD  QUEtiapine (SEROQUEL) 25 MG tablet Take 25 mg by mouth at bedtime.   Yes Historical Provider, MD  senna (SENOKOT) 8.6 MG TABS tablet Take 1 tablet by mouth 2 (two) times daily.    Yes Historical Provider, MD  polyethylene glycol (MIRALAX / GLYCOLAX) packet Take 17 g by mouth daily as needed for moderate constipation.     Historical Provider, MD   Physical Exam: Filed Vitals:   02/24/2014 1445 02/24/2014 1500 02/15/2014 1530 02/22/2014 1630  BP: 120/70  109/72 120/68  Pulse:    100  Temp:  97.1 F (36.2 C)    TempSrc:  Rectal    Resp: _0 SpO2:    100%    Wt Readings from Last 3 Encounters:  02/10/14 56.7 kg (125 lb)  01/20/14 59.875 kg (132 lb)  01/09/14 57.425 kg (126 lb 9.6 oz)    General:  Uncomfortable, chronically ill male Eyes: PERRL, normal lids, irises & conjunctiva ENT: grossly normal hearing, lips & tongue Neck: no LAD, masses or thyromegaly Cardiovascular: RRR, no m/r/g. No LE edema. Respiratory: CTA bilaterally, no w/r/r. Normal respiratory effort. Abdomen: soft, ntnd Skin: no rash or induration seen on limited exam Musculoskeletal: grossly normal tone BUE/BLE Psychiatric: grossly normal mood and affect, speech fluent and appropriate Neurologic: grossly non-focal.          Labs on Admission:  Basic Metabolic Panel:  Recent Labs Lab 02/16/2014 1520  NA 145  K 4.8  CL 104  CO2 25  GLUCOSE 148*  BUN 36*  CREATININE 1.49*  CALCIUM 9.5   Liver Function Tests:  Recent Labs Lab 02/12/2014 1520  AST 66*  ALT 40  ALKPHOS 109  BILITOT 0.7  PROT 8.6*  ALBUMIN 2.6*   No results for input(s): LIPASE, AMYLASE in the last 168 hours. No results for input(s): AMMONIA in the last 168 hours. CBC:  Recent Labs Lab 02/24/2014 1245  WBC 7.7  NEUTROABS 6.7  HGB 7.7*  HCT 25.1*  MCV 75.6*  PLT 189   Cardiac Enzymes:  Recent Labs Lab 02/18/2014 1245  TROPONINI <0.30    BNP (last 3  results)  Recent Labs  05/16/13 2228 02/25/2014 1307  PROBNP 88.2 611.9*   CBG: No results for input(s): GLUCAP in the last 168 hours.  Radiological Exams on Admission: Ct Head Wo Contrast  03/02/2014   CLINICAL DATA:  History of lung carcinoma.  Altered mental status  EXAM: CT HEAD WITHOUT CONTRAST  TECHNIQUE: Contiguous axial images were obtained from the base of the skull through the vertex without intravenous contrast.  COMPARISON:  Head CT January 09, 2014 and brain MRI January 14, 2014  FINDINGS: A mass in the right parietal lobe is again noted with fairly extensive vasogenic edema, increased, as well as increase in acute hemorrhage within this mass. This mass currently measures 3.4 x 3.0 cm. No other mass or hemorrhage is appreciable. There is mild diffuse atrophy elsewhere. There is no subdural or epidural fluid. No midline shift. There is patchy small vessel disease in the centra semiovale bilaterally, stable. The bony calvarium appears intact. The mastoid air cells are clear.  IMPRESSION: Mass in the right parietal lobe has become larger with an increase in hemorrhage compared to recent prior studies. There is moderate vasogenic edema in this region. No new lesions are identified. Elsewhere there is atrophy with periventricular small vessel disease. No midline shift. No extra-axial fluid apparent.   Electronically Signed   By: Lowella Grip M.D.   On: 03/02/2014 14:33   Dg Chest Port 1 View  02/23/2014   CLINICAL DATA:  Shortness of breath with altered mental status; states for lung malignancy  EXAM: PORTABLE CHEST - 1 VIEW  COMPARISON:  Chest x-ray of December 17, 2013  FINDINGS: Again demonstrated is a large right hilar mass. There is abnormal appearance of the lung parenchyma in the right mid and lower hemithorax. On the left mildly increased interstitial density has progressed since the previous study. There is no shift of the midline. The cardiac silhouette is normal. The pulmonary  vascularity is not engorged.  IMPRESSION: Progressive increased interstitial densities in both lungs may reflect pneumonia superimposed upon known lung malignancy. The right hilar mass has increased in conspicuity. There is no pneumothorax or pulmonary vascular congestion.   Electronically Signed   By: David  Martinique   On: 03/05/2014 12:41    EKG: Independently reviewed. Poor quality  Assessment/Plan Active Problems:   Protein-calorie malnutrition, severe   Stage 4 lung cancer, right   HCAP (healthcare-associated pneumonia)   AKI (acute kidney injury)   DNR (do not resuscitate) discussion   HCAPNA- IV abx, blood cultures, monitor in SDU for now- transition to comfort if not improved Nebs, O2  Pain- morphine  AKI- s/p IVF, monitor  Stage 4 lung cancer- mets to brain-  Mass in the right parietal lobe has become larger with an increase in hemorrhage compared to recent prior studies. There is moderate vasogenic edema in this region  Protein calorie malnutrition  PCCM  Code Status: DNR DVT Prophylaxis: Family Communication: wife, Delcie Roch at bedside Disposition Plan:   Time spent: 66 min  Eulogio Bear [facility] Pager [phone]

## 2014-02-11 NOTE — ED Notes (Signed)
Tried calling report at [facility] was put on hold for 5 minutes. Called back at 1846.

## 2014-02-11 NOTE — ED Provider Notes (Signed)
CSN: [id number]     Arrival date & time 02/17/2014  1141 History   First MD Initiated Contact with Patient 03/07/2014 1149     Chief Complaint  Patient presents with  . Altered Mental Status     (Consider location/radiation/quality/duration/timing/severity/associated sxs/prior Treatment) Patient is a 73 y.o. male presenting with altered mental status. The history is provided by the EMS personnel.  Altered Mental Status Presenting symptoms: partial responsiveness   Severity:  Moderate Most recent episode:  Today Episode history:  Single Timing:  Constant Progression:  Resolved Chronicity:  New Context comment:  While at rest Associated symptoms: no abdominal pain, no fever, no headaches, no nausea and no vomiting     Past Medical History  Diagnosis Date  . Bronchitis   . Glaucoma   . Heart disease   . Hyperlipidemia   . Hypertension   . CAD (coronary artery disease)     Remote cath ?1998.   . Tobacco abuse   . Glaucoma (increased eye pressure)   . 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 S/P chemotherapy, time since greater than 12 weeks    Past Surgical History  Procedure Laterality Date  . None    . Cardiac catheterization     Family History  Problem Relation Age of Onset  . Hyperlipidemia Mother   . Heart disease Mother   . Stroke Mother   . Hypertension Mother   . Kidney disease Mother   . Diabetes Mother   . Diabetes Father   . Hypertension Maternal Grandfather   . Hyperlipidemia Paternal Grandmother   . Diabetes Paternal Grandmother   . Heart attack Maternal Grandfather    History  Substance Use Topics  . Smoking status: Former Smoker -- 0.30 packs/day for 54 years  . Smokeless tobacco: Former Systems developer     Comment: smokes a lot less than he used to  . Alcohol Use: No    Review of Systems  Constitutional: Negative for fever.  HENT: Negative for drooling and rhinorrhea.   Eyes: Negative for pain.  Respiratory: Negative for  cough and shortness of breath.   Cardiovascular: Negative for chest pain and leg swelling.  Gastrointestinal: Negative for nausea, vomiting, abdominal pain and diarrhea.  Genitourinary: Negative for dysuria and hematuria.  Musculoskeletal: Negative for gait problem and neck pain.  Skin: Negative for color change.  Neurological: Negative for numbness and headaches.  Hematological: Negative for adenopathy.  Psychiatric/Behavioral: Negative for behavioral problems.  All other systems reviewed and are negative.     Allergies  Review of patient's allergies indicates no known allergies.  Home Medications   Prior to Admission medications   Medication Sig Start Date End Date Taking? Authorizing Provider  albuterol (PROVENTIL HFA;VENTOLIN HFA) 108 (90 BASE) MCG/ACT inhaler Inhale 2 puffs into the lungs every 6 (six) hours as needed for wheezing or shortness of breath. 08/19/13   Domenic Polite, MD  carvedilol (COREG) 3.125 MG tablet Take 1 tablet (3.125 mg total) by mouth 2 (two) times daily with a meal. 01/08/14   Belkys A Regalado, MD  dexamethasone (DECADRON) 4 MG tablet Take 1 tablet (4 mg total) by mouth as directed. 4 mg four times daily for 1 week, then 4 mg three times daily for 1 week, then 4 mg twice daily for 1 week, then 2 mg twice daily for 1 week, then 2 mg once daily for 1 week, then stop 01/21/14   Lora Paula,  MD  docusate sodium (COLACE) 100 MG capsule Take 100 mg by mouth daily.     Historical Provider, MD  escitalopram (LEXAPRO) 10 MG tablet Take 10 mg by mouth daily.    Historical Provider, MD  furosemide (LASIX) 20 MG tablet Take 20 mg by mouth daily after breakfast.     Historical Provider, MD  levETIRAcetam (KEPPRA) 500 MG tablet Take 1 tablet (500 mg total) by mouth 2 (two) times daily. 01/08/14   Belkys A Regalado, MD  morphine (MSIR) 30 MG tablet Take one tablet by mouth every 12 hours for pain 02/10/14   Blanchie Serve, MD  nystatin (MYCOSTATIN) 100000 UNIT/ML  suspension Take 5 mLs by mouth 4 (four) times daily.    Historical Provider, MD  ondansetron (ZOFRAN) 8 MG tablet Take 8 mg by mouth 2 (two) times daily as needed for nausea or vomiting.     Historical Provider, MD  oxyCODONE (ROXICODONE) 15 MG immediate release tablet Take 1 tablet (15 mg total) by mouth every 4 (four) hours as needed. 01/12/14   Tiffany L Reed, DO  pantoprazole (PROTONIX) 40 MG tablet Take 1 tablet (40 mg total) by mouth daily. 01/08/14   Belkys A Regalado, MD  polyethylene glycol (MIRALAX / GLYCOLAX) packet Take 17 g by mouth daily as needed for moderate constipation.     Historical Provider, MD  senna (SENOKOT) 8.6 MG TABS tablet Take 1 tablet by mouth 2 (two) times daily.     Historical Provider, MD  sertraline (ZOLOFT) 25 MG tablet Take 25 mg by mouth daily with breakfast.     Historical Provider, MD   BP 151/130 mmHg  Pulse 179  Resp 32  SpO2 100% Physical Exam  Constitutional: He appears well-developed and well-nourished.  HENT:  Head: Normocephalic and atraumatic.  Right Ear: External ear normal.  Left Ear: External ear normal.  Nose: Nose normal.  Mouth/Throat: Oropharynx is clear and moist. No oropharyngeal exudate.  Eyes: Conjunctivae and EOM are normal. Pupils are equal, round, and reactive to light.  Neck: Normal range of motion. Neck supple.  Cardiovascular: Normal rate, regular rhythm, normal heart sounds and intact distal pulses.  Exam reveals no gallop and no friction rub.   No murmur heard. Pulmonary/Chest: He is in respiratory distress. He has no wheezes.  Tachynpnea w/ rhonchorous breath sounds bilaterally.   Abdominal: Soft. Bowel sounds are normal. He exhibits no distension. There is no tenderness. There is no rebound and no guarding.  Musculoskeletal: Normal range of motion. He exhibits no edema or tenderness.  Neurological: He is alert.  A/o x 2. Only follow some commands. Cannot lift his legs off the bed.   Skin: Skin is warm and dry.   Psychiatric: He has a normal mood and affect. His behavior is normal.  Nursing note and vitals reviewed.   ED Course  Procedures (including critical care time) Labs Review Labs Reviewed  CBC WITH DIFFERENTIAL - Abnormal; Notable for the following:    RBC 3.32 (*)    Hemoglobin 7.7 (*)    HCT 25.1 (*)    MCV 75.6 (*)    MCH 23.2 (*)    RDW 23.2 (*)    Neutrophils Relative % 87 (*)    Lymphocytes Relative 7 (*)    Lymphs Abs 0.5 (*)    All other components within normal limits  PRO B NATRIURETIC PEPTIDE - Abnormal; Notable for the following:    Pro B Natriuretic peptide (BNP) 611.9 (*)    All other  components within normal limits  URINALYSIS, ROUTINE W REFLEX MICROSCOPIC - Abnormal; Notable for the following:    Color, Urine AMBER (*)    APPearance CLOUDY (*)    Hgb urine dipstick TRACE (*)    All other components within normal limits  COMPREHENSIVE METABOLIC PANEL - Abnormal; Notable for the following:    Glucose, Bld 148 (*)    BUN 36 (*)    Creatinine, Ser 1.49 (*)    Total Protein 8.6 (*)    Albumin 2.6 (*)    AST 66 (*)    GFR calc non Af Amer 45 (*)    GFR calc Af Amer 52 (*)    Anion gap 16 (*)    All other components within normal limits  URINE MICROSCOPIC-ADD ON - Abnormal; Notable for the following:    Casts HYALINE CASTS (*)    All other components within normal limits  I-STAT CG4 LACTIC ACID, ED - Abnormal; Notable for the following:    Lactic Acid, Venous 8.46 (*)    All other components within normal limits  I-STAT CG4 LACTIC ACID, ED - Abnormal; Notable for the following:    Lactic Acid, Venous 5.06 (*)    All other components within normal limits  CULTURE, BLOOD (ROUTINE X 2)  CULTURE, BLOOD (ROUTINE X 2)  URINE CULTURE  MRSA PCR SCREENING  TROPONIN I    Imaging Review Ct Head Wo Contrast  02/15/2014   CLINICAL DATA:  History of lung carcinoma.  Altered mental status  EXAM: CT HEAD WITHOUT CONTRAST  TECHNIQUE: Contiguous axial images were  obtained from the base of the skull through the vertex without intravenous contrast.  COMPARISON:  Head CT January 09, 2014 and brain MRI January 14, 2014  FINDINGS: A mass in the right parietal lobe is again noted with fairly extensive vasogenic edema, increased, as well as increase in acute hemorrhage within this mass. This mass currently measures 3.4 x 3.0 cm. No other mass or hemorrhage is appreciable. There is mild diffuse atrophy elsewhere. There is no subdural or epidural fluid. No midline shift. There is patchy small vessel disease in the centra semiovale bilaterally, stable. The bony calvarium appears intact. The mastoid air cells are clear.  IMPRESSION: Mass in the right parietal lobe has become larger with an increase in hemorrhage compared to recent prior studies. There is moderate vasogenic edema in this region. No new lesions are identified. Elsewhere there is atrophy with periventricular small vessel disease. No midline shift. No extra-axial fluid apparent.   Electronically Signed   By: Lowella Grip M.D.   On: 03/06/2014 14:33   Dg Chest Port 1 View  02/09/2014   CLINICAL DATA:  Shortness of breath with altered mental status; states for lung malignancy  EXAM: PORTABLE CHEST - 1 VIEW  COMPARISON:  Chest x-ray of December 17, 2013  FINDINGS: Again demonstrated is a large right hilar mass. There is abnormal appearance of the lung parenchyma in the right mid and lower hemithorax. On the left mildly increased interstitial density has progressed since the previous study. There is no shift of the midline. The cardiac silhouette is normal. The pulmonary vascularity is not engorged.  IMPRESSION: Progressive increased interstitial densities in both lungs may reflect pneumonia superimposed upon known lung malignancy. The right hilar mass has increased in conspicuity. There is no pneumothorax or pulmonary vascular congestion.   Electronically Signed   By: David  Martinique   On: 02/10/2014 12:41     EKG  Interpretation  None      MDM   Final diagnoses:  SOB (shortness of breath)  Altered mental status  HCAP (healthcare-associated pneumonia)  Elevated lactic acid level    12:09 PM 73 y.o. male w hx of lung cancer w/ brain mets s/p recent admission for bleeding metastasis in the right frontal lobe who pw AMS. His facility reports that prior to arrival he had called out for help. When they found him he was slumped over in his chair and were about to initiate CPR when I found a pulse. He is alert and oriented 2 on my exam. He does not know the year. He has no complaints and denies any pain. He is tachypneic with rhonchorous breath sounds.he is afebrile and vital signs are unremarkable here. We'll get screening labs and imaging.  Case discussed w/ Dr. Kathyrn Sheriff ([facility]), he has no further recs at this time. CC consulted who met w/ the family and made him DNR. Will admit to hospitalist to stepdown.     Pamella Pert, MD 02/12/14 [phone]

## 2014-02-11 NOTE — ED Notes (Signed)
Attempted report 

## 2014-02-11 NOTE — ED Notes (Signed)
Per EMS, Patient was brought from [facility]. Patient was witnessed to slump over in the chair. Patient was moved to the hospital bed and prepared to do chest compressions, but staff never actually preformed compressions. Facility reports periods of Apnea, but EMS did not witness this. When EMS arrived, Patient had eyes closed and was labored breathing. EMS reports Rhonchi in all lung fields. Patient has a history of this, but nurse at facility stated it sounded worse today. Patient will answer short questions. Patient has a history of Stage IV Lung Cancer and Brain Cancer. Patient's normal is wheelchair bound and does not speak much. Vitals per EMS: 146/86, 100 HR, 32 RR Labored, 94 CBG.

## 2014-02-12 DIAGNOSIS — C7931 Secondary malignant neoplasm of brain: Secondary | ICD-10-CM

## 2014-02-12 DIAGNOSIS — J9601 Acute respiratory failure with hypoxia: Secondary | ICD-10-CM

## 2014-02-12 DIAGNOSIS — E785 Hyperlipidemia, unspecified: Secondary | ICD-10-CM | POA: Insufficient documentation

## 2014-02-12 DIAGNOSIS — C3490 Malignant neoplasm of unspecified part of unspecified bronchus or lung: Secondary | ICD-10-CM | POA: Insufficient documentation

## 2014-02-12 DIAGNOSIS — E46 Unspecified protein-calorie malnutrition: Secondary | ICD-10-CM | POA: Insufficient documentation

## 2014-02-12 DIAGNOSIS — Z515 Encounter for palliative care: Secondary | ICD-10-CM

## 2014-02-12 DIAGNOSIS — I10 Essential (primary) hypertension: Secondary | ICD-10-CM | POA: Insufficient documentation

## 2014-02-12 LAB — BASIC METABOLIC PANEL
Anion gap: 14 (ref 5–15)
BUN: 44 mg/dL — AB (ref 6–23)
CO2: 23 mEq/L (ref 19–32)
Calcium: 8.7 mg/dL (ref 8.4–10.5)
Chloride: 113 mEq/L — ABNORMAL HIGH (ref 96–112)
Creatinine, Ser: 1.62 mg/dL — ABNORMAL HIGH (ref 0.50–1.35)
GFR, EST AFRICAN AMERICAN: 47 mL/min — AB (ref >90)
GFR, EST NON AFRICAN AMERICAN: 41 mL/min — AB (ref >90)
GLUCOSE: 126 mg/dL — AB (ref 70–99)
Potassium: 4.6 mEq/L (ref 3.7–5.3)
SODIUM: 150 meq/L — AB (ref 137–147)

## 2014-02-12 LAB — TYPE AND SCREEN
ABO/RH(D): A POS
Antibody Screen: NEGATIVE
Unit division: 0
Unit division: 0

## 2014-02-12 LAB — CBC
HCT: 19.2 % — ABNORMAL LOW (ref 39.0–52.0)
Hemoglobin: 6 g/dL — CL (ref 13.0–17.0)
MCH: 23.3 pg — AB (ref 26.0–34.0)
MCHC: 31.3 g/dL (ref 30.0–36.0)
MCV: 74.7 fL — ABNORMAL LOW (ref 78.0–100.0)
Platelets: 154 10*3/uL (ref 150–400)
RBC: 2.57 MIL/uL — ABNORMAL LOW (ref 4.22–5.81)
RDW: 23.2 % — AB (ref 11.5–15.5)
WBC: 6.7 10*3/uL (ref 4.0–10.5)

## 2014-02-12 LAB — LEGIONELLA ANTIGEN, URINE

## 2014-02-12 LAB — URINE CULTURE
COLONY COUNT: NO GROWTH
Culture: NO GROWTH

## 2014-02-12 LAB — ABO/RH: ABO/RH(D): A POS

## 2014-02-12 LAB — STREP PNEUMONIAE URINARY ANTIGEN: STREP PNEUMO URINARY ANTIGEN: NEGATIVE

## 2014-02-12 LAB — PREPARE RBC (CROSSMATCH)

## 2014-02-12 LAB — HIV ANTIBODY (ROUTINE TESTING W REFLEX): HIV: NONREACTIVE

## 2014-02-12 MED ORDER — LORAZEPAM 2 MG/ML IJ SOLN
INTRAMUSCULAR | Status: AC
  Administered 2014-02-12: 0.5 mg via INTRAVENOUS
  Filled 2014-02-12: qty 1

## 2014-02-12 MED ORDER — MORPHINE BOLUS VIA INFUSION
2.0000 mg | INTRAVENOUS | Status: DC | PRN
  Filled 2014-02-12: qty 2

## 2014-02-12 MED ORDER — LORAZEPAM 2 MG/ML IJ SOLN
0.5000 mg | Freq: Once | INTRAMUSCULAR | Status: AC
  Administered 2014-02-12: 0.5 mg via INTRAVENOUS

## 2014-02-12 MED ORDER — ALBUTEROL SULFATE (2.5 MG/3ML) 0.083% IN NEBU
2.5000 mg | INHALATION_SOLUTION | Freq: Four times a day (QID) | RESPIRATORY_TRACT | Status: DC
  Administered 2014-02-13: 2.5 mg via RESPIRATORY_TRACT
  Filled 2014-02-12 (×4): qty 3

## 2014-02-12 MED ORDER — SODIUM CHLORIDE 0.9 % IV SOLN
Freq: Once | INTRAVENOUS | Status: AC
  Administered 2014-02-12: 05:00:00 via INTRAVENOUS

## 2014-02-12 MED ORDER — LORAZEPAM 2 MG/ML IJ SOLN
1.0000 mg | Freq: Three times a day (TID) | INTRAMUSCULAR | Status: DC
  Administered 2014-02-12 – 2014-02-13 (×2): 1 mg via INTRAVENOUS
  Filled 2014-02-12: qty 1

## 2014-02-12 MED ORDER — SODIUM CHLORIDE 0.9 % IV SOLN: INTRAVENOUS | Status: DC

## 2014-02-12 MED ORDER — LEVETIRACETAM IN NACL 500 MG/100ML IV SOLN
500.0000 mg | Freq: Two times a day (BID) | INTRAVENOUS | Status: DC
  Filled 2014-02-12 (×2): qty 100

## 2014-02-12 MED ORDER — LORAZEPAM 2 MG/ML IJ SOLN
0.5000 mg | INTRAMUSCULAR | Status: DC | PRN
  Filled 2014-02-12: qty 1

## 2014-02-12 MED ORDER — SCOPOLAMINE 1 MG/3DAYS TD PT72
1.0000 | MEDICATED_PATCH | TRANSDERMAL | Status: DC
  Administered 2014-02-12: 1.5 mg via TRANSDERMAL
  Filled 2014-02-12 (×2): qty 1

## 2014-02-12 MED ORDER — SODIUM CHLORIDE 0.9 % IV SOLN
1.0000 mg/h | INTRAVENOUS | Status: DC
  Administered 2014-02-12 – 2014-02-14 (×2): 1 mg/h via INTRAVENOUS
  Filled 2014-02-12 (×2): qty 10

## 2014-02-12 NOTE — Progress Notes (Signed)
Chaplain paged to assist wife whose husband was actively dying.  Chaplain offered comfort measures, and listened to wife's story of multiple illnesses and loss.  Periodically wife was calling family members to report "the Doctor said you need to come if you want to see him alive."  Wife and son arrived.  I talked with all three...and offered spiritual support.   Will refer to oncoming chaplain.  Rev. [place], [place]

## 2014-02-12 NOTE — Progress Notes (Signed)
Nutrition Brief Note  Chart reviewed. Pt is receiving full comfort care.  No nutrition interventions warranted at this time.  Please consult as needed.    Molli Barrows, RD, LDN, [place] Pager [phone] After Hours Pager [phone]

## 2014-02-12 NOTE — Progress Notes (Signed)
Pt will not leave Venti mask on. Pt switched to 5l [place] pox 93%.  Will continue to monitor pt status. Wife at bed side.   2230 pt desated to 73% on 5L [place] pt placed on Venti mask at 55% Pt pox remained 82%. Pt placed on non re-breather mask. NP was notified.

## 2014-02-12 NOTE — Clinical Documentation Improvement (Signed)
Presents with recurrent pneumonia, stage iv lung cancer with vasogenic edema, acute respiratory failure, severe malnutrition.   "Clearly aspirating" in Consult note on 11/4  CXR reveals "There is abnormal appearance of the lung parenchyma in the right mid and lower hemithorax".   Please clarify the likely type of pneumonia the patient has and document findings in progress note and discharge summary.  Thank You, Zoila Shutter ,RN Clinical Documentation Specialist:  [place] [employer]

## 2014-02-12 NOTE — Progress Notes (Signed)
Pt again will not leave mask on pt place on 6L [place]. Will continue to monitor

## 2014-02-12 NOTE — Progress Notes (Signed)
Comments:  Was called and ask by RN to come speak with pt's wife. Pt has deteriorated overnight and is a DNR however RN concerned pt's wife is feeling tenuous regarding pt's code status, asking "what else can we do" and not seeming to appreciate pt's grave prognosis. Went to see Mrs Spellman who had stepped out to the lobby. After some discussion feel she does appreciate her husband's grave condition. She is aware that his condition has worsened overnight and confirms that she does not want aggressive resuscitation efforts and agrees with his DNR status. Wife relates several bad experiences related to loosing family members from which she was estranged (mother and sister). I discussed with her that I would request a palliative care meeting today in hopes of further defining EOL/GOC. She is agreeable with this plan.  She accepted my offer of support from the [place] who has been paged. She voiced appreciation for the time I spent with her. Upon my departure Mrs. Bilbo had plans to call their daughter and siblings of the pt to let them know to come see him as soon as possible. I have informed Heather, RN of the discussion. Palliative Care Consult placed.  Jeryl [place], NP-C [facility] Pager [phone]

## 2014-02-12 NOTE — Progress Notes (Signed)
   02/12/14 0912  Clinical Encounter Type  Visited With Patient and family together  Visit Type Spiritual support  Referral From Nurse;Chaplain  Consult/Referral To Chaplain  Spiritual Encounters  Spiritual Needs Grief support;Emotional;Prayer  Stress Factors  Family Stress Factors Loss    Chaplain visited family in response to spiritual care consult and referral from on-call chaplain. Chaplain had met pt, pt's wife, and pt's son-in-law and grandchildren in September when pt was last hospitalized. Pt responded when chaplain spoke to him. Pt's wife said, "We all have to go sometime," and "I think he'll be leaving here in peace," though she also noted at times pt says "I'm ready" while other times pt says "I'm not ready." Chaplain offered prayer for peace and comfort for both pt and family. Will continue to follow. Please page chaplain if needed.   Mariah Milling, Chaplain 02/12/2014 9:15 AM

## 2014-02-12 NOTE — Progress Notes (Signed)
[place] TEAM 1 - Stepdown/ICU TEAM Progress Note  [name] ZHG:[id number] DOB: 1940-07-03 DOA: 02/12/2014 PCP: [name], MD  Admit HPI / Brief Narrative: 73 y.o. 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 S/P stereotactic radiosurgery 10/14 (only Tx). Per family uponn Dx of Lung Ca initially refused Tx.   [facility] from [facility]. Records show patient was slumped over in bed. EMs was called and when they arrived, he had labored breathing. Facility reports periods of apnea but that was not confirmed by EMS.  In the ER, PCCM saw patient as breathing labored, lactic acid elevated and was initially a full code. They spoke with family and made patient a DNR due to his overall poor prognosis.  Wife does not want CPR, intubation, pressors or central line. They decided if patient were to worsen, he would be transitioned to comfort care.  Patient's only complaints are hurting all over and being cold   HPI/Subjective: 11/5 patient tachypnea, nonresponsive  Assessment/Plan: Metastatic NSCLC to Brain:  - Spoke with Wife and Family and have agreed to make Comfort Care  - Stop all medication except Keppra to prevent seizures - Start Morphine gtt - Ativan PRN - Continue Scopolamine Patch, and NTS to control secretions  Acute Respiratory Failure  -See Metastatic NSCLC - Comfort Care   HTN -Stable on no medication -Comfort Care  HLD  -Currently not an issue -Comfort Care  Tobacco Abuse  -Currently not an issue -Comfort Care  HCAP -Have D/Ced ABx per family request  -Comfort Care  Protein calorie malnutrition -Currently not an issue -Comfort Care   Code Status: COMFORT CARE Family Communication: WIFE and family at bedside during exam Disposition Plan: COMFORT CARE    Consultants: Dr. Rhea Pink (palliative care)  Procedure/Significant Events: NA   Culture NA  Antibiotics: NA  DVT  prophylaxis: NA   Devices NA   LINES / TUBES:      Continuous Infusions:   Objective: VITAL SIGNS: Temp: 98.3 F (36.8 C) (11/05 0356) Temp Source: Axillary (11/05 0356) BP: 139/75 mmHg (11/05 0356) Pulse Rate: 99 (11/05 0356) SPO2; FIO2:   Intake/Output Summary (Last 24 hours) at 02/12/14 0851 Last data filed at 02/12/14 0358  Gross per 24 hour  Intake 2033.33 ml  Output    451 ml  Net 1582.33 ml     Exam: General: Solmulence, will not follow commands, Mod respiratory distress Lungs: Tachypnea, Diffuse Rhonchi, without wheezes or crackles Cardiovascular: Regular rate and rhythm without murmur gallop or rub normal S1 and S2 Abdomen: Nontender, nondistended, soft, bowel sounds positive, no rebound, no ascites, no appreciable mass Extremities: No significant cyanosis, clubbing, or edema bilateral lower extremities  Data Reviewed: Basic Metabolic Panel:  Recent Labs Lab 02/25/2014 1520 02/12/14 0244  NA 145 150*  K 4.8 4.6  CL 104 113*  CO2 25 23  GLUCOSE 148* 126*  BUN 36* 44*  CREATININE 1.49* 1.62*  CALCIUM 9.5 8.7   Liver Function Tests:  Recent Labs Lab 02/25/2014 1520  AST 66*  ALT 40  ALKPHOS 109  BILITOT 0.7  PROT 8.6*  ALBUMIN 2.6*   No results for input(s): LIPASE, AMYLASE in the last 168 hours. No results for input(s): AMMONIA in the last 168 hours. CBC:  Recent Labs Lab 03/05/2014 1245 02/12/14 0244  WBC 7.7 6.7  NEUTROABS 6.7  --   HGB 7.7* 6.0*  HCT 25.1* 19.2*  MCV 75.6* 74.7*  PLT 189 154   Cardiac Enzymes:  Recent Labs Lab 03/07/2014 1245  TROPONINI <0.30   BNP (last 3 results)  Recent Labs  05/16/13 2228 02/26/2014 1307  PROBNP 88.2 611.9*   CBG: No results for input(s): GLUCAP in the last 168 hours.  Recent Results (from the past 240 hour(s))  MRSA PCR Screening     Status: Abnormal   Collection Time: 02/24/2014  7:22 PM  Result Value Ref Range Status   MRSA by PCR POSITIVE (A) NEGATIVE Final    Comment:         The GeneXpert MRSA Assay (FDA approved for NASAL specimens only), is one component of a comprehensive MRSA colonization surveillance program. It is not intended to diagnose MRSA infection nor to guide or monitor treatment for MRSA infections. RESULT CALLED TO, READ BACK BY AND VERIFIED WITH: Marsh Dolly RN 2209 02/10/2014 A BROWNING   Culture, respiratory (NON-Expectorated)     Status: None (Preliminary result)   Collection Time: 02/18/2014 10:48 PM  Result Value Ref Range Status   Specimen Description TRACHEAL ASPIRATE  Final   Special Requests NONE  Final   Gram Stain   Final    MODERATE WBC PRESENT,BOTH PMN AND MONONUCLEAR RARE SQUAMOUS EPITHELIAL CELLS PRESENT ABUNDANT GRAM POSITIVE COCCI IN PAIRS RARE YEAST RARE GRAM POSITIVE RODS    Culture PENDING  Incomplete   Report Status PENDING  Incomplete     Studies:  Recent x-ray studies have been reviewed in detail by the Attending Physician  Scheduled Meds:  Scheduled Meds: . ceFEPime (MAXIPIME) IV  1 g Intravenous Q24H  . dexamethasone  4 mg Oral Q12H   Followed by  . [START ON 02/13/2014] dexamethasone  2 mg Oral Q12H   Followed by  . [START ON 02/20/2014] dexamethasone  2 mg Oral Daily  . levETIRAcetam  500 mg Oral BID  . multivitamin with minerals  1 tablet Oral BID  . pantoprazole  40 mg Oral Daily  . QUEtiapine  25 mg Oral QHS  . scopolamine  1 patch Transdermal Q72H  . vancomycin  750 mg Intravenous Q24H    Time spent on care of this patient: 40 mins   Allie Bossier , MD   [facility] Office  [phone] Pager - [phone]  On-Call/Text Page:      [email or web address]      password [email or web address]  If 7PM-7AM, please contact night-coverage [email or web address] Password [email or web address] 02/12/2014, 8:51 AM   LOS: 1 day

## 2014-02-12 NOTE — Progress Notes (Signed)
Utilization review completed.  

## 2014-02-12 NOTE — Progress Notes (Addendum)
Reached [facility] this am to speak to family RE: Pt status.  Chaplain rounded this am. Will continue to monitor.

## 2014-02-12 NOTE — Consult Note (Signed)
Palliative Medicine Team at [facility]  Date: 02/12/2014   Patient Name: [name]  DOB: 11/30/1940  MRN: [id number]  Age / Sex: 73 y.o., male   PCP: [name], MD Referring Physician: Allie Bossier, MD  Active Problems: Active Problems:   Protein-calorie malnutrition, severe   Stage 4 lung cancer, right   HCAP (healthcare-associated pneumonia)   AKI (acute kidney injury)   DNR (do not resuscitate) discussion   HPI/Reason for Consultation: Quevedo is a 73 y.o. male with end stage metastatic lung cancer, actively dying. Recurrent PNA and failure to thrive.   Participants in Discussion: HCPOA: yes   Advance Directive:    Code Status Orders        Start     Ordered   02/20/2014 2015  Do not attempt resuscitation (DNR)   Continuous    Question Answer Comment  In the event of cardiac or respiratory ARREST Do not call a "code blue"   In the event of cardiac or respiratory ARREST Do not perform Intubation, CPR, defibrillation or ACLS   In the event of cardiac or respiratory ARREST Use medication by any route, position, wound care, and other measures to relive pain and suffering. May use oxygen, suction and manual treatment of airway obstruction as needed for comfort.      02/17/2014 2014       @ADVDIR @  I have reviewed the medical record, interviewed the patient and family, and examined the patient. The following aspects are pertinent.  Past Medical History  Diagnosis Date  . Bronchitis   . Glaucoma   . Heart disease   . Hyperlipidemia   . Hypertension   . CAD (coronary artery disease)     Remote cath ?1998.   . Tobacco abuse   . Glaucoma (increased eye pressure)   . 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 S/P chemotherapy, time since greater than 12 weeks    History   Social History  . Marital Status: Married    Spouse Name: N/A    Number of Children: 1  . Years of Education: N/A   Occupational History  .  Retired-car salesman    Social History Main Topics  . Smoking status: Former Smoker -- 0.30 packs/day for 54 years  . Smokeless tobacco: Former Systems developer     Comment: smokes a lot less than he used to  . Alcohol Use: No  . Drug Use: No  . Sexual Activity: Not Currently   Other Topics Concern  . None   Social History Narrative   Family History  Problem Relation Age of Onset  . Hyperlipidemia Mother   . Heart disease Mother   . Stroke Mother   . Hypertension Mother   . Kidney disease Mother   . Diabetes Mother   . Diabetes Father   . Hypertension Maternal Grandfather   . Hyperlipidemia Paternal Grandmother   . Diabetes Paternal Grandmother   . Heart attack Maternal Grandfather    Scheduled Meds: . albuterol  2.5 mg Nebulization Q6H  . levETIRAcetam  500 mg Intravenous Q12H  . scopolamine  1 patch Transdermal Q72H   Continuous Infusions: . morphine     PRN Meds:.LORazepam No Known Allergies CBC:    Component Value Date/Time   WBC 6.7 02/12/2014 0244   WBC 5.8 01/02/2014 0824   HGB 6.0* 02/12/2014 0244   HGB 7.3* 01/02/2014 0824   HCT 19.2* 02/12/2014 0244  HCT 23.6* 01/02/2014 0824   PLT 154 02/12/2014 0244   PLT 391 01/02/2014 0824   MCV 74.7* 02/12/2014 0244   MCV 71.2* 01/02/2014 0824   NEUTROABS 6.7 03/03/2014 1245   NEUTROABS 3.8 01/02/2014 0824   LYMPHSABS 0.5* 03/01/2014 1245   LYMPHSABS 1.1 01/02/2014 0824   MONOABS 0.5 02/13/2014 1245   MONOABS 0.7 01/02/2014 0824   EOSABS 0.0 02/16/2014 1245   EOSABS 0.1 01/02/2014 0824   BASOSABS 0.0 02/28/2014 1245   BASOSABS 0.0 01/02/2014 0824   Comprehensive Metabolic Panel:    Component Value Date/Time   NA 150* 02/12/2014 0244   NA 141 01/02/2014 0824   K 4.6 02/12/2014 0244   K 3.5 01/02/2014 0824   CL 113* 02/12/2014 0244   CO2 23 02/12/2014 0244   CO2 34* 01/02/2014 0824   BUN 44* 02/12/2014 0244   BUN 13.2 01/02/2014 0824   CREATININE 1.62* 02/12/2014 0244   CREATININE 1.2 01/02/2014 0824    GLUCOSE 126* 02/12/2014 0244   GLUCOSE 120 01/02/2014 0824   CALCIUM 8.7 02/12/2014 0244   CALCIUM 9.3 01/02/2014 0824   AST 66* 02/21/2014 1520   AST 20 01/02/2014 0824   ALT 40 02/21/2014 1520   ALT 6 01/02/2014 0824   ALKPHOS 109 03/09/2014 1520   ALKPHOS 64 01/02/2014 0824   BILITOT 0.7 02/13/2014 1520   BILITOT 0.32 01/02/2014 0824   PROT 8.6* 02/25/2014 1520   PROT 8.8* 01/02/2014 0824   ALBUMIN 2.6* 02/09/2014 1520   ALBUMIN 2.1* 01/02/2014 0824    Vital Signs: BP 139/75 mmHg  Pulse 99  Temp(Src) 98.3 F (36.8 C) (Axillary)  Resp 25  Ht 5' 10"  (1.778 m)  Wt 53.1 kg (117 lb 1 oz)  BMI 16.80 kg/m2  SpO2 96% Filed Weights   02/15/2014 1919  Weight: 53.1 kg (117 lb 1 oz)   11/04 0701 - 11/05 0700 In: 2033.3 [I.V.:2033.3] Out: 846 [Urine:450; Stool:1]  Physical Exam:  General Appearance: frail and slightly agitated non-verbal but will squeeze hand.  HEENT:mouth is dry  Lungs: scattered rhonchi bilaterally CV: tachycardiaregular rate and rhythm Abdomen:soft, non-tender; bowel sounds normal; no masses,  no organomegaly Extremities:no edema Skin:Skin color, texture, turgor normal. No rashes or lesions Psych: dying   Neuro: impaired due to illness  Summary of Established Goals of Care and Medical Treatment Preferences  Primary Diagnoses  1. NSCLC, stage IV 2. Obstructive PNA 3. Failure to Thrive, Debility   Active Symptoms: 1. Severe Dyspnea 2. Agitation 3. Pain   Psycho-social/Spiritual: Large loving family at bedside  Prognosis: hours    Palliative Performance Scale: 10   Recommendations:  1. Code Status: DNR  2. Scope of Treatment:    Full Comfort Care 3. Symptom Management: 1. Morphine infusion with titration orders 2. Scheduled Ativan for seizure prophylaxis and sedation 4. Palliative Prophylaxis: mouth care and dulcolax  5. Disposition: Hospital death   Time: 9-9:50AM Time Total: 50 minutes Greater than 50%  of this time was spent  counseling and coordinating care related to the above assessment and plan.  Signed by: Roma Schanz, DO  02/12/2014, 9:36 AM  Please contact Palliative Medicine Team phone at [phone] for questions and concerns.

## 2014-02-13 NOTE — Progress Notes (Signed)
Met with wife, sister and sister-in-law at bedside.  Discussed and confirmed goal of comfort.  Patient is  Minimally responsive to gentle touch, shallow resp. Effort, skin cool to touch.  MS gtt infusing at 73m/hr, PRN ativan ordered if needed for restlessness.  It appears the patient is actively dying, transfer OOF would not be appropriate.  Wife expresses her grief at the impending loss, but hope that her faith and family will sustain her.  Patient sister is tearful, MTaosis 166years older, he took over raising the younger siblings after their father died. Encouraged her to tell him how much he is loved, she sang at the bedside for him.    Prognosis is likely less than 24hrs.  Will continue to support symptom management as needed.  SKizzie Fantasia RN, MSN, CSoutheast [facility] CenterPalliative Integration

## 2014-02-13 NOTE — Progress Notes (Signed)
Patient ID: [name], male   DOB: [birth year], 73 y.o.   MRN: [id number]  [facility] PROGRESS NOTE  QUAYSHAWN NIN XBM:[id number] DOB: [birth year] DOA: 03/04/2014 PCP: [name], MD  Brief narrative: 73 y.o. 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 S/P stereotactic radiosurgery 10/14 (only Tx), brought to ER from [facility]. Records showed patient was slumped over in bed. EMS was called and when they arrived, he had labored breathing with periods of apnea.  In the ER, PCCM saw patient, given overall poor prognosis, per family request and per pt's wishes, DNR status confirmed and comfort care desired.   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:  - Spoke with wife and family, desiring full comfort, will respect wishes  - d/c telemetry monitoring  - Stop all medication except Keppra to prevent seizures - Continue Morphine gtt - Ativan PRN - Continue Scopolamine Patch, and NTS to control secretions - no further blood work to ensure comfort   Acute on chronic renal failure, stage II - from no oral intake and dehydration - no further blood work as noted above - per pt's request, no life prolonging interventions desired - ensure full comfort   Acute Respiratory Failure  - secondary to NSCLC and HCAP - Comfort Care   HTN -Stable on no medication -Comfort Care  HLD  -Currently not an issue -Comfort Care  Tobacco Abuse  -Currently not an issue -Comfort Care  HCAP -Have D/Ced ABx per family request  -Comfort Care  Protein calorie malnutrition -Currently not an issue, pt unable to take any PO -Comfort Care  Code Status: COMFORT CARE Family Communication: WIFE and family at bedside during exam Disposition Plan: COMFORT CARE, ? [facility]   DVT prophylaxis: None, in order to ensure comfort for pt, per family request   IV Access:   Peripheral IV Procedures and diagnostic studies:   Ct Head Wo Contrast  02/10/2014  Mass  in the right parietal lobe has become larger with an increase in hemorrhage compared to recent prior studies. There is moderate vasogenic edema in this region. No new lesions are identified. Elsewhere there is atrophy with periventricular small vessel disease. No midline shift. No extra-axial fluid apparent.    Medical Consultants:   PCT Other Consultants:   None   Anti-Infectives:   None   Faye Ramsay, MD  [facility] Pager [phone]  If 7PM-7AM, please contact night-coverage [email or web address] Password [email or web address] 02/13/2014, 1:18 PM   LOS: 2 days   HPI/Subjective: No events overnight.   Objective: Filed Vitals:   02/24/2014 1919 02/22/2014 2314 03/05/2014 2340 02/12/14 0356  BP: 115/73 123/82  139/75  Pulse:  94 97 99  Temp: 98.4 F (36.9 C) 97.3 F (36.3 C)  98.3 F (36.8 C)  TempSrc: Axillary Axillary  Axillary  Resp: 22 24 26 25   Height: 5\' 10"  (1.778 m)     Weight: 53.1 kg (117 lb 1 oz)     SpO2: 100% 100%  96%    Intake/Output Summary (Last 24 hours) at 02/13/14 1318 Last data filed at 02/12/14 2300  Gross per 24 hour  Intake      0 ml  Output    800 ml  Net   -800 ml    Exam:   General:  Pt is unresponsive, NAD  Cardiovascular: Regular rate and rhythm, S1/S2, no murmurs, no rubs, no gallops  Respiratory: Course breath sounds bilaterally   Abdomen: Soft, non tender, non distended,  bowel sounds present, no guarding  Data Reviewed: Basic Metabolic Panel:  Recent Labs Lab 02/26/2014 1520 02/12/14 0244  NA 145 150*  K 4.8 4.6  CL 104 113*  CO2 25 23  GLUCOSE 148* 126*  BUN 36* 44*  CREATININE 1.49* 1.62*  CALCIUM 9.5 8.7   Liver Function Tests:  Recent Labs Lab 02/20/2014 1520  AST 66*  ALT 40  ALKPHOS 109  BILITOT 0.7  PROT 8.6*  ALBUMIN 2.6*   CBC:  Recent Labs Lab 02/08/2014 1245 02/12/14 0244  WBC 7.7 6.7  NEUTROABS 6.7  --   HGB 7.7* 6.0*  HCT 25.1* 19.2*  MCV 75.6* 74.7*  PLT 189 154   Cardiac Enzymes:  Recent Labs Lab  02/19/2014 1245  TROPONINI <0.30    Recent Results (from the past 240 hour(s))  Blood culture (routine x 2)     Status: None (Preliminary result)   Collection Time: 03/01/2014  1:11 PM  Result Value Ref Range Status   Specimen Description BLOOD RIGHT HAND  Final   Special Requests BOTTLES DRAWN AEROBIC ONLY 5CC  Final   Culture  Setup Time   Final    03/04/2014 17:54 Performed at [facility]    Culture   Final           BLOOD CULTURE RECEIVED NO GROWTH TO DATE CULTURE WILL BE HELD FOR 5 DAYS BEFORE ISSUING A FINAL NEGATIVE REPORT Performed at [facility]    Report Status PENDING  Incomplete  Urine culture     Status: None   Collection Time: 02/10/2014  3:04 PM  Result Value Ref Range Status   Specimen Description   Final    URINE, CATHETERIZED Performed at [place] Performed at [facility]   Final   Culture  Setup Time   Final    03/07/2014 21:27 Performed at [place] Performed at [facility]   Final   Culture NO GROWTH Performed at [facility]   Final   Report Status 02/12/2014 FINAL  Final  MRSA PCR Screening     Status: Abnormal   Collection Time: 02/27/2014  7:22 PM  Result Value Ref Range Status   MRSA by PCR POSITIVE (A) NEGATIVE Final    Comment:        The GeneXpert MRSA Assay (FDA approved for NASAL specimens only), is one component of a comprehensive MRSA colonization surveillance program. It is not intended to diagnose MRSA infection nor to guide or monitor treatment for MRSA infections. RESULT CALLED TO, READ BACK BY AND VERIFIED WITH: Marsh Dolly RN [id number] 02/15/2014 A BROWNING   Blood culture (routine x 2)     Status: None (Preliminary result)   Collection Time: 02/21/2014  9:17 PM  Result Value Ref Range Status   Specimen Description BLOOD RIGHT FOREARM  Final   Special Requests BOTTLES DRAWN AEROBIC AND ANAEROBIC 10CC  Final   Culture   Setup Time   Final    02/12/2014 00:43 Performed at [facility]    Culture   Final           BLOOD CULTURE RECEIVED NO GROWTH TO DATE CULTURE WILL BE HELD FOR 5 DAYS BEFORE ISSUING A FINAL NEGATIVE REPORT Performed at [facility]    Report Status PENDING  Incomplete  Culture, respiratory (NON-Expectorated)     Status: None (Preliminary result)   Collection Time:  02/08/2014 10:48 PM  Result Value Ref Range Status   Specimen Description TRACHEAL ASPIRATE  Final   Special Requests NONE  Final   Gram Stain   Final    MODERATE WBC PRESENT,BOTH PMN AND MONONUCLEAR RARE SQUAMOUS EPITHELIAL CELLS PRESENT ABUNDANT GRAM POSITIVE COCCI IN PAIRS RARE YEAST RARE GRAM POSITIVE RODS    Culture   Final    ABUNDANT GRAM NEGATIVE RODS Performed at [facility]    Report Status PENDING  Incomplete     Scheduled Meds: . albuterol  2.5 mg Nebulization Q6H  . LORazepam  1 mg Intravenous TID  . scopolamine  1 patch Transdermal Q72H   Continuous Infusions: . sodium chloride    . morphine 3 mg/hr (02/13/14 0400)

## 2014-02-16 LAB — CULTURE, RESPIRATORY W GRAM STAIN

## 2014-02-16 LAB — CULTURE, RESPIRATORY

## 2014-02-16 NOTE — Care Management Note (Signed)
    Page 1 of 1   02/16/2014     10:58:09 AM CARE MANAGEMENT NOTE 02/16/2014  Patient:  [name], [name]   Account Number:  [id number]  Date Initiated:  02/12/2014  Documentation initiated by:  Marvetta Gibbons  Subjective/Objective Assessment:   Pt admitted with HCAP/resp. distress     Action/Plan:   PTA pt was at [facility]- pt DNR and plan is for full comfort care- expect hospital death   Anticipated DC Date:  2014-03-12   Anticipated DC Plan:  EXPIRED         Choice offered to / List presented to:             Status of service:  Completed, signed off Medicare Important Message given?  NA - LOS <3 / Initial given by admissions (If response is "NO", the following Medicare IM given date fields will be blank) Date Medicare IM given:   Medicare IM given by:   Date Additional Medicare IM given:   Additional Medicare IM given by:    Discharge Disposition:  EXPIRED  Per UR Regulation:  Reviewed for med. necessity/level of care/duration of stay  If discussed at [place] of Stay Meetings, dates discussed:    Comments:

## 2014-02-17 LAB — CULTURE, BLOOD (ROUTINE X 2): Culture: NO GROWTH

## 2014-02-18 LAB — CULTURE, BLOOD (ROUTINE X 2): Culture: NO GROWTH

## 2014-02-23 ENCOUNTER — Ambulatory Visit: Payer: Medicare Other | Admitting: Radiation Oncology

## 2014-03-10 NOTE — Discharge Summary (Addendum)
  Death Summary  [name] DOB: January 02, 1941 DOA: February 15, 2014  PCP: Jesse Sims, MD PCP/Office notified: notified   Admit date: 02/15/2014 Date of Death: 02/18/14 @ 8:25 am   Final Diagnoses:   Active Problems:   Acute respiratory failure with hypoxia   Stage 4 lung cancer, right   HCAP (healthcare-associated pneumonia)   AKI (acute kidney injury)   DNR (do not resuscitate) discussion   Non-small cell lung cancer   Metastatic cancer to brain   Essential hypertension   HLD (hyperlipidemia)   Protein-calorie malnutrition, severe  Brief narrative: 73 y.o. 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 S/P stereotactic radiosurgery 10/14 (only Tx), brought to ER from [facility]. Records showed patient was slumped over in bed. EMS was called and when they arrived, he had labored breathing with periods of apnea.  In the ER, PCCM saw patient, given overall poor prognosis, per family request and per pt's wishes, DNR status confirmed and comfort care desired.   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:  - Spoke with wife and family, desiring full comfort - pt has been unresponsive since the admission - comfort ensured and pt passed away 18-Feb-2014  Anemia of chronic disease, NSCLC with acute bleeding in the brain  - no blood work done to ensure comfort - brain hemorrhage on CT head noted   Acute on chronic renal failure, stage II - from no oral intake and dehydration - no further blood work done in order to respect family wishes and ensure comfort  - per pt's request, no life prolonging interventions desired  Acute Respiratory Failure  - secondary to NSCLC and HCAP - Comfort Care   HTN -Stable on no medication -Comfort Care  HLD  -Currently not an issue -Comfort Care  Tobacco Abuse  -Currently not  an issue -Comfort Care  HCAP -Have D/Ced ABx per family request  -Comfort Care  Protein calorie malnutrition, severe - pt unable to take any PO since arrival to the hospital  -Comfort Care  Code Status: COMFORT CARE Family Communication: WIFE and family at bedside during exam  DVT prophylaxis: None, in order to ensure comfort for pt, per family request   Procedures and diagnostic studies:    Ct Head Wo Contrast 2014-02-15 Mass in the right parietal lobe has become larger with an increase in hemorrhage compared to recent prior studies. There is moderate vasogenic edema in this region. No new lesions are identified. Elsewhere there is atrophy with periventricular small vessel disease. No midline shift. No extra-axial fluid apparent.  Medical Consultants:    PCT Other Consultants:    None  Anti-Infectives:    None  Time: 9:00 am  Signed:  Faye Ramsay  [facility] Feb 18, 2014, 8:57 AM  Faye Ramsay, MD  [facility] Pager [phone]  If 7PM-7AM, please contact night-coverage [email or web address] Password [email or web address]

## 2014-03-10 NOTE — Progress Notes (Signed)
Pts body picked up by the funeral home.

## 2014-03-10 NOTE — Progress Notes (Signed)
Notified Dr. Doyle Askew of pt passing.

## 2014-03-10 NOTE — Progress Notes (Signed)
Pt passed this morning at 0827. 2 RN verified by Noreene Larsson RN and Sharlyne Pacas RN. Pts pupils fixed and nonreactive, absence of pulse and respirations. Family present and at bedside. Notified Dr. Doyle Askew who also verified death.  [place] [facility] notified. Bed control notified. Family denied need for chaplain.

## 2014-03-10 DEATH — deceased

## 2014-05-17 ENCOUNTER — Encounter: Payer: Self-pay | Admitting: Internal Medicine

## 2016-09-01 IMAGING — CT CT CHEST W/ CM
2 of 5 series · 16 of 46 positions shown, 18 images · IV contrast (OMNIPAQUE)
Comparison: CT chest 08/18/2013.

CLINICAL DATA: Lung cancer, chemotherapy complete. Right sided
chest pain with shortness of breath and constipation.

EXAM:
CT CHEST, ABDOMEN, AND PELVIS WITH CONTRAST
TECHNIQUE: Multidetector CT imaging of the chest, abdomen and pelvis was
performed following the standard protocol during bolus
administration of intravenous contrast.
CONTRAST:  100mL OMNIPAQUE IOHEXOL 300 MG/ML  SOLN

[Series 2: cap with st · axial · 0.76mm/px · z∈[-568,-18]mm · 13 of 126 slices shown, 15 images]
[im 8/126  soft-tissue]
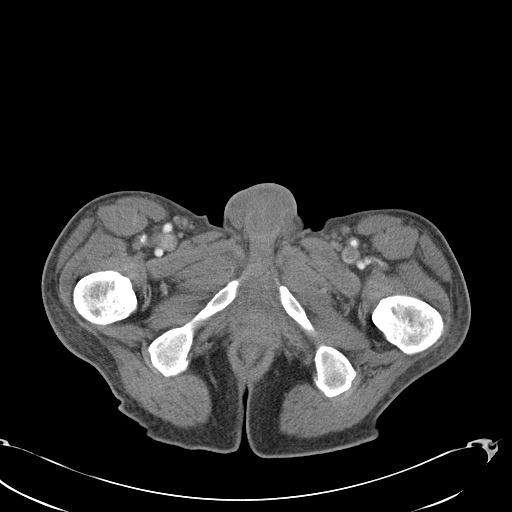
[im 8/126  bone]
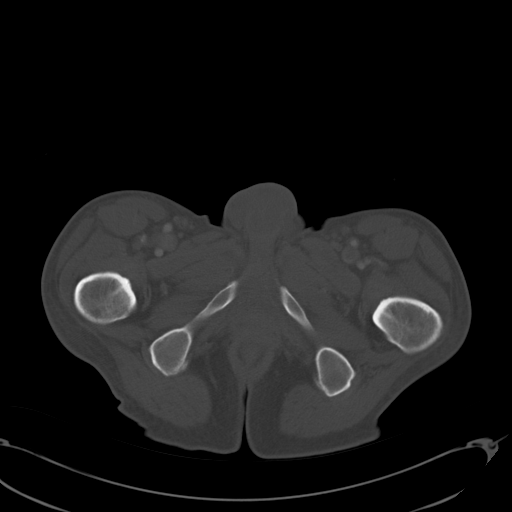
[im 16/126  soft-tissue]
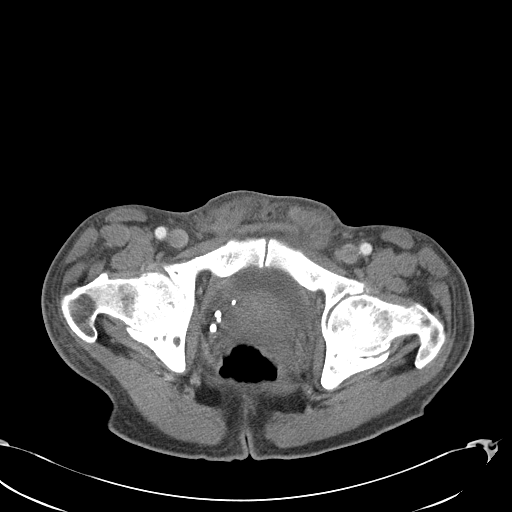
[im 24/126  soft-tissue]
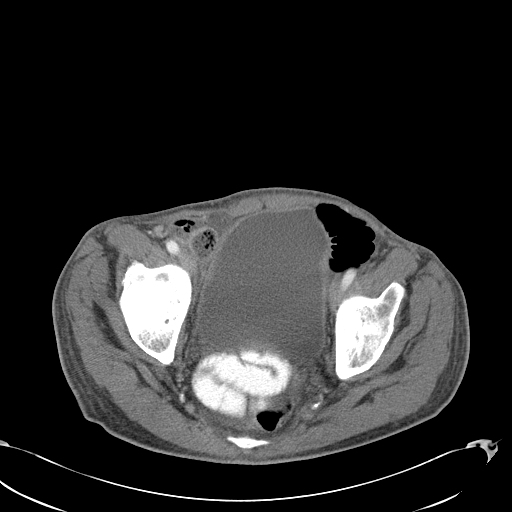
[im 40/126  soft-tissue]
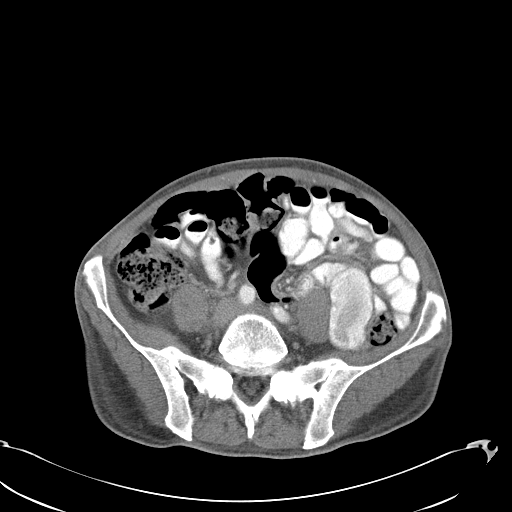
[im 47/126  soft-tissue]
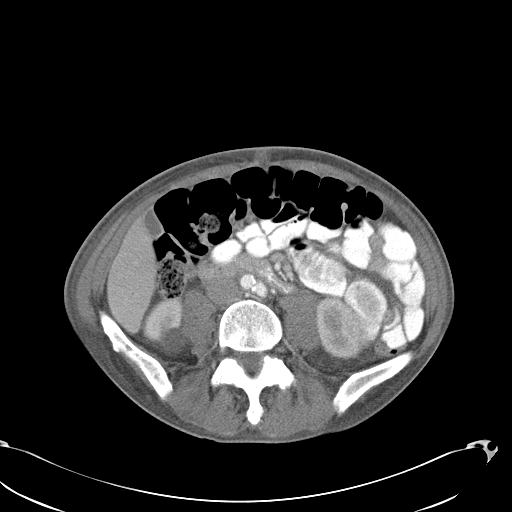
[im 55/126  soft-tissue]
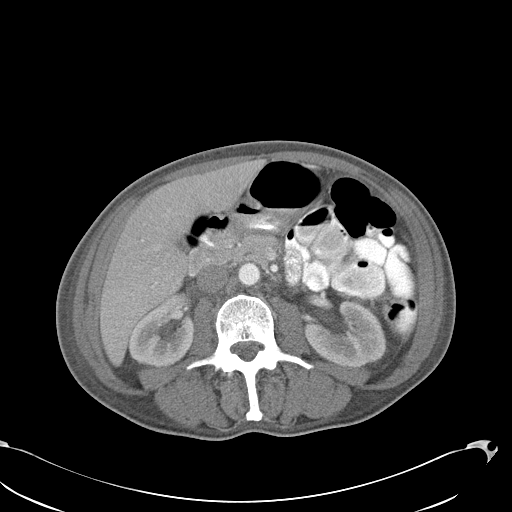
[im 63/126  soft-tissue]
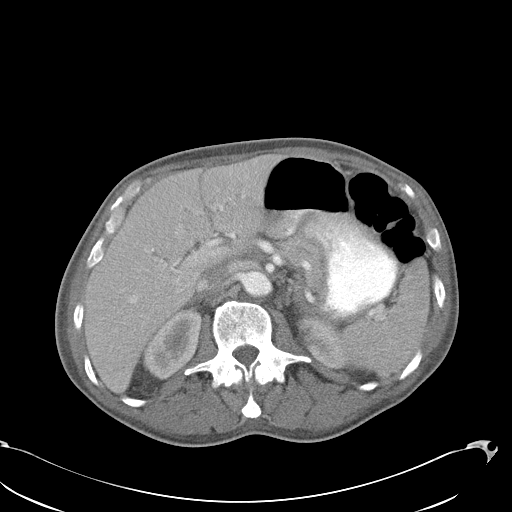
[im 71/126  soft-tissue]
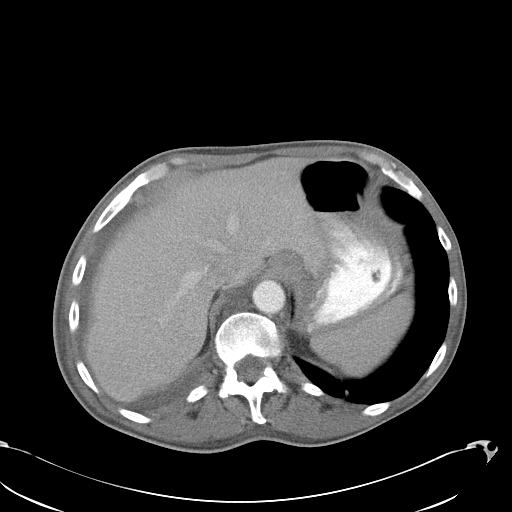
[im 79/126  soft-tissue]
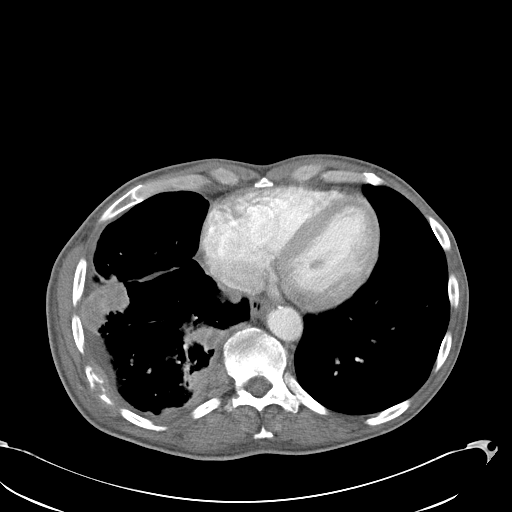
[im 79/126  bone]
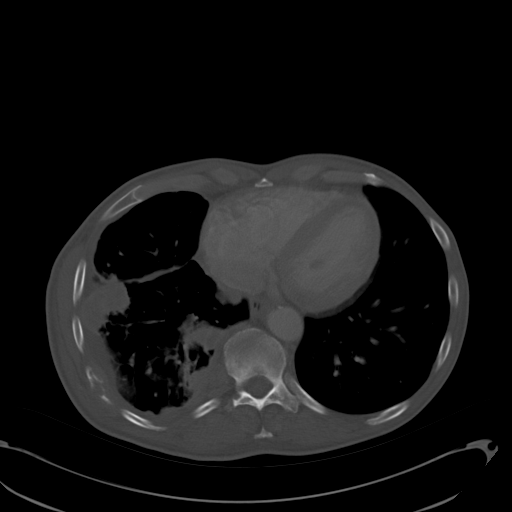
[im 86/126  soft-tissue]
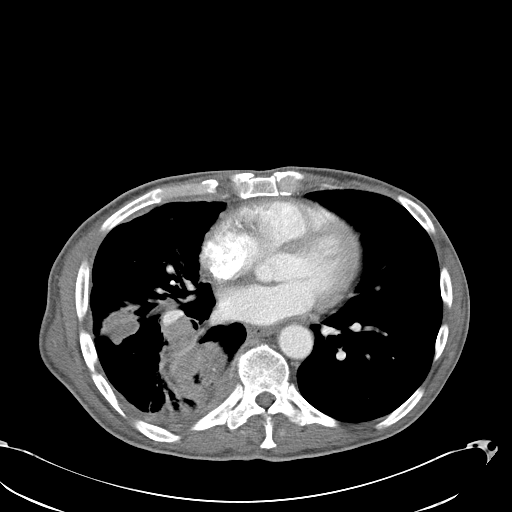
[im 102/126  soft-tissue]
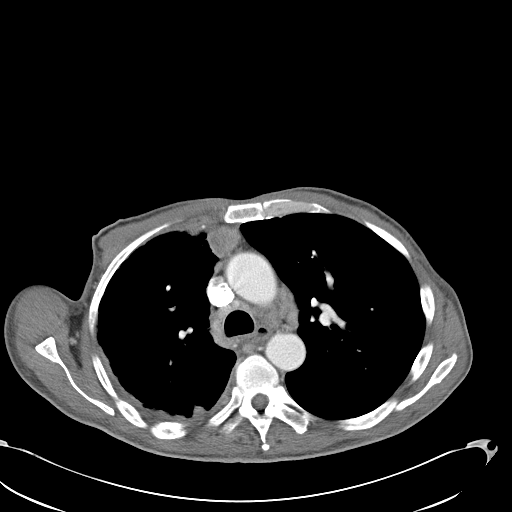
[im 110/126  soft-tissue]
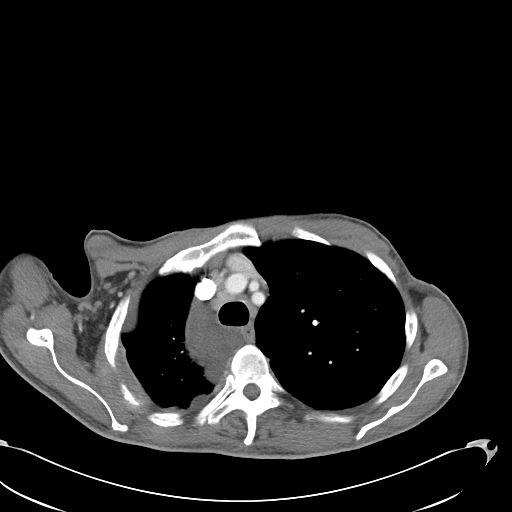
[im 118/126  soft-tissue]
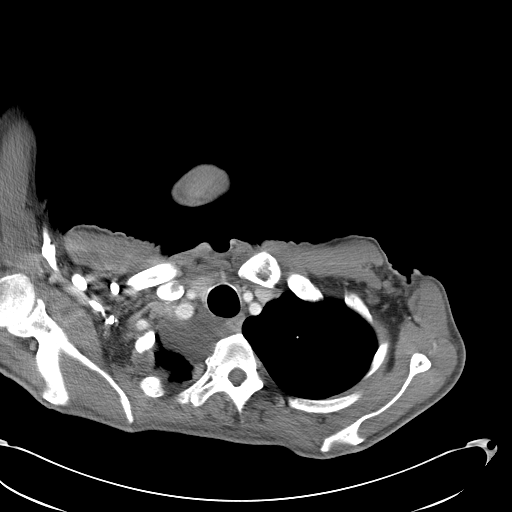

[Series 602: <mpr thick range> · coronal · 1.23mm/px · 3 of 86 slices shown]
[im 29/86  soft-tissue]
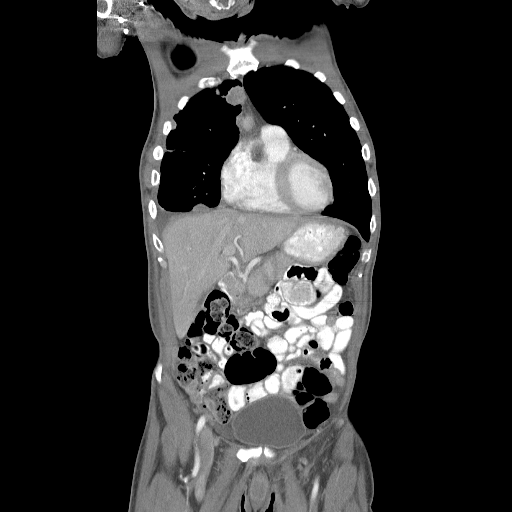
[im 38/86  soft-tissue]
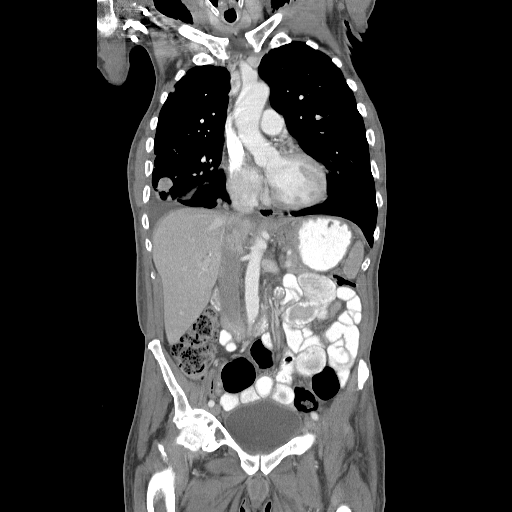
[im 48/86  soft-tissue]
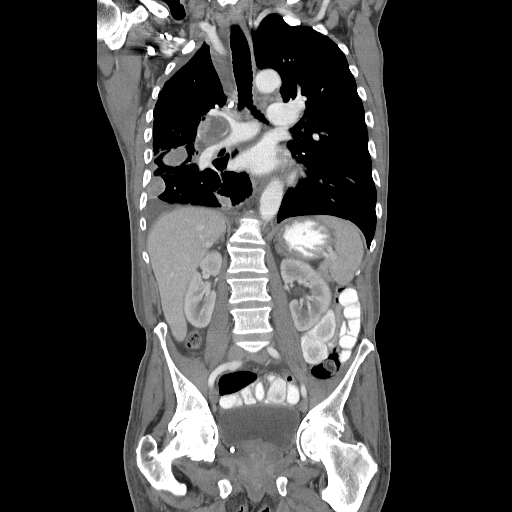

[16 of 46 positions shown; findings below may reference images not displayed]

FINDINGS: CT CHEST FINDINGS

Aright internal jugular/ high right paratracheal lymph node measures
12 mm (previously 8 mm). Mediastinal adenopathy measures up to 12 mm
in the AP window (previously 8 mm). Right hilar mass measures
approximately 9.2 x 9.6 cm (previously 6.0 x 7.9 cm when
remeasured). No axillary adenopathy. Heart size normal. No
pericardial effusion.

Moderate amount of loculated right pleural fluid, increased from the
prior exam. Pleural nodules have increased in number and size in the
right hemi thorax. Index nodule in the anteromedial right hemi
thorax measures 2.5 cm (previously 1.3 cm). Centrilobular emphysema.
No left pleural fluid. Mild narrowing of the bronchus intermedius.
Airway is otherwise unremarkable.

CT ABDOMEN AND PELVIS FINDINGS

Hepatobiliary: An intermediate density lesion in the inferior left
hepatic lobe measures 9 mm and 46 Hounsfield units (series 2, image
75). Additional tiny low-attenuation lesions in the liver measure up
to 3 mm, too small to characterize. Gallbladder is unremarkable. No
biliary ductal dilatation.

Adrenals/urinary tract: Adrenal glands are unremarkable.
Low-attenuation lesions in the kidneys measure up to 2.4 cm on the
right and are likely cysts. Ureters are decompressed. Bladder is
unremarkable.

Spleen: Negative.

Pancreas: Negative.

Stomach/Bowel: Stomach is unremarkable. Mild scattered gaseous
prominence of small bowel, nonspecific. Small bowel, colon, small
bowel mesentery and omentum are otherwise unremarkable.

Reproductive: Prostate is enlarged.

Vascular/Lymphatic: Atherosclerotic calcification of the arterial
vasculature without abdominal aortic aneurysm. No pathologically
enlarged lymph nodes.

Other: Small pelvic free fluid.

Musculoskeletal: Degenerative changes are seen in the hips and
spine. Lytic destruction is seen in the posterolateral right third
rib. A small lytic lesion is also seen in the right ninth posterior
lateral rib. A small focal lucency in the medial aspect of the left
iliac wing (series 2, image 90) is nonspecific.
IMPRESSION: 1. Interval progression in stage IV lung cancer, as evidenced by an
enlarging right hilar mass as well as worsening intrathoracic
adenopathy and right pleural implants/effusion.
2. Right rib metastases, as before.
3. Intermediate density lesion in the inferior left hepatic lobe is
indeterminate. Metastatic disease is not excluded.
4. Small pelvic free fluid.
5. Prostate enlargement.
